# Patient Record
Sex: Female | Born: 2000 | Race: Black or African American | Hispanic: No | Marital: Single | Smoking: Never smoker
Health system: Southern US, Community
[De-identification: ages and names within clinical notes are randomized; demographics above are authoritative.]

## PROBLEM LIST (undated history)

## (undated) ENCOUNTER — Emergency Department (HOSPITAL_COMMUNITY): Disposition: A | Discharge status: Home / Self Care | Payer: Self-pay

## (undated) DIAGNOSIS — Z789 Other specified health status: Secondary | ICD-10-CM

## (undated) HISTORY — PX: MOUTH SURGERY: SHX715

## (undated) HISTORY — DX: Other specified health status: Z78.9

---
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
## 2019-01-10 ENCOUNTER — Encounter: Payer: Medicaid Other | Admitting: Advanced Practice Midwife

## 2019-01-12 ENCOUNTER — Encounter: Payer: Medicaid Other | Admitting: Obstetrics & Gynecology

## 2019-01-15 ENCOUNTER — Other Ambulatory Visit: Payer: Self-pay

## 2019-01-15 ENCOUNTER — Encounter (HOSPITAL_COMMUNITY): Payer: Self-pay | Admitting: Obstetrics and Gynecology

## 2019-01-15 ENCOUNTER — Inpatient Hospital Stay (HOSPITAL_COMMUNITY)
Admission: AD | Admit: 2019-01-15 | Discharge: 2019-01-15 | Disposition: A | Discharge status: Home / Self Care | Payer: Medicaid Other | Attending: Obstetrics and Gynecology | Admitting: Obstetrics and Gynecology

## 2019-01-15 ENCOUNTER — Inpatient Hospital Stay (HOSPITAL_COMMUNITY): Payer: Medicaid Other

## 2019-01-15 DIAGNOSIS — O26891 Other specified pregnancy related conditions, first trimester: Secondary | ICD-10-CM | POA: Diagnosis present

## 2019-01-15 DIAGNOSIS — O26899 Other specified pregnancy related conditions, unspecified trimester: Secondary | ICD-10-CM

## 2019-01-15 DIAGNOSIS — R109 Unspecified abdominal pain: Secondary | ICD-10-CM | POA: Insufficient documentation

## 2019-01-15 DIAGNOSIS — O3680X0 Pregnancy with inconclusive fetal viability, not applicable or unspecified: Secondary | ICD-10-CM

## 2019-01-15 DIAGNOSIS — Z8249 Family history of ischemic heart disease and other diseases of the circulatory system: Secondary | ICD-10-CM | POA: Insufficient documentation

## 2019-01-15 DIAGNOSIS — Z3A10 10 weeks gestation of pregnancy: Secondary | ICD-10-CM | POA: Insufficient documentation

## 2019-01-15 LAB — CBC
HCT: 35.6 % — ABNORMAL LOW (ref 36.0–46.0)
Hemoglobin: 12.2 g/dL (ref 12.0–15.0)
MCH: 31.9 pg (ref 26.0–34.0)
MCHC: 34.3 g/dL (ref 30.0–36.0)
MCV: 93 fL (ref 80.0–100.0)
Platelets: 213 10*3/uL (ref 150–400)
RBC: 3.83 MIL/uL — ABNORMAL LOW (ref 3.87–5.11)
RDW: 12.2 % (ref 11.5–15.5)
WBC: 13.2 10*3/uL — ABNORMAL HIGH (ref 4.0–10.5)
nRBC: 0 % (ref 0.0–0.2)

## 2019-01-15 LAB — URINALYSIS, ROUTINE W REFLEX MICROSCOPIC
Bilirubin Urine: NEGATIVE
Glucose, UA: NEGATIVE mg/dL
Ketones, ur: NEGATIVE mg/dL
Nitrite: NEGATIVE
Protein, ur: NEGATIVE mg/dL
Specific Gravity, Urine: 1.024 (ref 1.005–1.030)
WBC, UA: >50 WBC/hpf — ABNORMAL HIGH (ref 0–5)
pH: 5 (ref 5.0–8.0)

## 2019-01-15 LAB — WET PREP, GENITAL
Clue Cells Wet Prep HPF POC: NONE SEEN
Sperm: NONE SEEN
Trich, Wet Prep: NONE SEEN
Yeast Wet Prep HPF POC: NONE SEEN

## 2019-01-15 LAB — TYPE AND SCREEN
ABO/RH(D): A POS
Antibody Screen: NEGATIVE

## 2019-01-15 LAB — POCT PREGNANCY, URINE: Preg Test, Ur: POSITIVE — AB

## 2019-01-15 NOTE — Discharge Instructions (Signed)
Abdominal Pain During Pregnancy ° °Belly (abdominal) pain is common during pregnancy. There are many possible causes. Most of the time, it is not a serious problem. Other times, it can be a sign that something is wrong with the pregnancy. Always tell your doctor if you have belly pain. °Follow these instructions at home: °· Do not have sex or put anything in your vagina until your pain goes away completely. °· Get plenty of rest until your pain gets better. °· Drink enough fluid to keep your pee (urine) pale yellow. °· Take over-the-counter and prescription medicines only as told by your doctor. °· Keep all follow-up visits as told by your doctor. This is important. °Contact a doctor if: °· Your pain continues or gets worse after resting. °· You have lower belly pain that: °? Comes and goes at regular times. °? Spreads to your back. °? Feels like menstrual cramps. °· You have pain or burning when you pee (urinate). °Get help right away if: °· You have a fever or chills. °· You have vaginal bleeding. °· You are leaking fluid from your vagina. °· You are passing tissue from your vagina. °· You throw up (vomit) for more than 24 hours. °· You have watery poop (diarrhea) for more than 24 hours. °· Your baby is moving less than usual. °· You feel very weak or faint. °· You have shortness of breath. °· You have very bad pain in your upper belly. °Summary °· Belly (abdominal) pain is common during pregnancy. There are many possible causes. °· If you have belly pain during pregnancy, tell your doctor right away. °· Keep all follow-up visits as told by your doctor. This is important. °This information is not intended to replace advice given to you by your health care provider. Make sure you discuss any questions you have with your health care provider. °Document Released: 12/31/2008 Document Revised: 05/02/2018 Document Reviewed: 04/16/2016 °Elsevier Patient Education © 2020 [employer] ° °

## 2019-01-15 NOTE — MAU Provider Note (Signed)
History     [id number]  Arrival date and time: 01/15/19 0973    Chief Complaint  Patient presents with  . Abdominal Pain  . Nausea     HPI Heather Hoffman is a 18 y.o. at [redacted]w[redacted]d by LMP, who presents for abdominal pain and nausea.   Has been feeling nauseous and crampy for about a month Able to take PO, keep it down Cramping is down low in lower abdomen and pelvis No vaginal bleeding Patient's last menstrual period was 11/05/2018. Regular period prior to conception No prior abdominal surgery Has appt to establish OB care on 02/01/2018 No LOF Slightly thicker discharge since becoming pregnant, no odor but sometimes uncomfortable     OB History    Gravida  1   Para      Term      Preterm      AB      Living        SAB      TAB      Ectopic      Multiple      Live Births              History reviewed. No pertinent past medical history.  Past Surgical History:  Procedure Laterality Date  . MOUTH SURGERY      Family History  Problem Relation Age of Onset  . Hypertension Mother   . Hypertension Maternal Grandfather     Social History   Socioeconomic History  . Marital status: Single    Spouse name: Not on file  . Number of children: Not on file  . Years of education: Not on file  . Highest education level: Not on file  Occupational History  . Not on file  Tobacco Use  . Smoking status: Never Smoker  . Smokeless tobacco: Never Used  Substance and Sexual Activity  . Alcohol use: Yes    Comment: stopped after finding out she was pregnant  . Drug use: Yes    Types: Marijuana    Comment: stopped after finding out she was pregnant  . Sexual activity: Not on file  Other Topics Concern  . Not on file  Social History Narrative  . Not on file   Social Determinants of Health   Financial Resource Strain:   . Difficulty of Paying Living Expenses: Not on file  Food Insecurity:   . Worried About Charity fundraiser in the Last Year: Not on  file  . Ran Out of Food in the Last Year: Not on file  Transportation Needs:   . Lack of Transportation (Medical): Not on file  . Lack of Transportation (Non-Medical): Not on file  Physical Activity:   . Days of Exercise per Week: Not on file  . Minutes of Exercise per Session: Not on file  Stress:   . Feeling of Stress : Not on file  Social Connections:   . Frequency of Communication with Friends and Family: Not on file  . Frequency of Social Gatherings with Friends and Family: Not on file  . Attends Religious Services: Not on file  . Active Member of Clubs or Organizations: Not on file  . Attends Archivist Meetings: Not on file  . Marital Status: Not on file  Intimate Partner Violence:   . Fear of Current or Ex-Partner: Not on file  . Emotionally Abused: Not on file  . Physically Abused: Not on file  . Sexually Abused: Not on file  No Known Allergies  No current facility-administered medications on file prior to encounter.   Current Outpatient Medications on File Prior to Encounter  Medication Sig Dispense Refill  . Prenatal Vit-Fe Fumarate-FA (PRENATAL MULTIVITAMIN) TABS tablet Take 1 tablet by mouth daily at 12 noon.    Marland Kitchen ibuprofen (ADVIL,MOTRIN) 600 MG tablet Take 1 tablet (600 mg total) by mouth every 8 (eight) hours as needed for fever, headache or moderate pain. 30 tablet 0     ROS Complete ROS completed and otherwise negative except as noted in HPI  Physical Exam   BP 114/66 (BP Location: Right Arm)   Pulse 98   Temp 98.4 F (36.9 C) (Oral)   Resp 16   LMP 11/05/2018   SpO2 100%   Physical Exam  Vitals reviewed. Constitutional: She appears well-developed and well-nourished. No distress.  Eyes: No scleral icterus.  Respiratory: Effort normal. No respiratory distress.  GI: Soft. She exhibits no distension. There is no abdominal tenderness. There is no rebound and no guarding.  Genitourinary:    Genitourinary Comments: SVE: per Elvina Mattes as  patient declined female examiner, she reported no CMT, closed cervix, no adnexal masses   Musculoskeletal:        General: No edema.  Neurological: She is alert. Coordination normal.  Skin: Skin is warm and dry. She is not diaphoretic.  Psychiatric: She has a normal mood and affect.    Ultrasound IMPRESSION: Single live intrauterine pregnancy measuring 9 weeks 1 day  FHT Doppler 165 bpm  MAU Course  Procedures CBC T&S UA UCx Wet prep GC/CT  MDM Moderate  Assessment and Plan  #Pregnancy unknown location #Abdominal pain in pregnancy Patient presenting with abdominal pain and pregnancy of unknown location, workup unremarkable with IUP at [redacted]w[redacted]d by [place] (does not meet criteria to change dates), A+ blood type, negative wet prep, unremarkable UA. Reassured discomfort likely secondary to advancing gestational age, has plan to establish OB care already. Reviewed warning signs/return precautions, d/c to home in stable condition.   Venora Maples

## 2019-01-15 NOTE — MAU Note (Signed)
Pt here with complaints of lower abdominal cramping and nausea. Denies vaginal bleeding or discharge. States she found out she was pregnant about 1 month ago. Thinks she is about 10 weeks. LMP: 11/05/2018.

## 2019-01-16 LAB — ABO/RH: ABO/RH(D): A POS

## 2019-01-16 LAB — GC/CHLAMYDIA PROBE AMP (~~LOC~~) NOT AT ARMC
Chlamydia: POSITIVE — AB
Comment: NEGATIVE
Comment: NORMAL
Neisseria Gonorrhea: NEGATIVE

## 2019-01-17 ENCOUNTER — Telehealth: Payer: Self-pay | Admitting: Student

## 2019-01-17 ENCOUNTER — Other Ambulatory Visit: Payer: Self-pay | Admitting: Family Medicine

## 2019-01-17 DIAGNOSIS — A749 Chlamydial infection, unspecified: Secondary | ICD-10-CM

## 2019-01-17 MED ORDER — AZITHROMYCIN 500 MG PO TABS: 1000.0000 mg | ORAL_TABLET | Freq: Once | ORAL | 0 refills | Status: AC

## 2019-01-17 NOTE — Telephone Encounter (Addendum)
Heather Hoffman tested positive for  Chlamydia. Patient was called by RN and allergies and pharmacy confirmed. Rx sent to pharmacy of choice.   Jorje Guild, NP 01/17/2019 1:29 PM       ----- Message from Bjorn Loser, RN sent at 01/17/2019  1:22 PM EST ----- This patient tested positive for :  chlamydia  She has "NKDA", I have informed the patient of her results and confirmed her pharmacy is correct in her chart. Please send Rx.   Thank you,   Bjorn Loser, RN   Results faxed to [facility] Department.

## 2019-01-18 LAB — CULTURE, OB URINE: Culture: 80000 — AB

## 2019-01-19 ENCOUNTER — Encounter: Payer: Self-pay | Admitting: Student

## 2019-01-19 ENCOUNTER — Telehealth: Payer: Self-pay | Admitting: Student

## 2019-01-19 ENCOUNTER — Telehealth (HOSPITAL_COMMUNITY): Payer: Self-pay

## 2019-01-19 ENCOUNTER — Other Ambulatory Visit: Payer: Self-pay | Admitting: Student

## 2019-01-19 DIAGNOSIS — N3001 Acute cystitis with hematuria: Secondary | ICD-10-CM

## 2019-01-19 DIAGNOSIS — A749 Chlamydial infection, unspecified: Secondary | ICD-10-CM | POA: Insufficient documentation

## 2019-01-19 DIAGNOSIS — N390 Urinary tract infection, site not specified: Secondary | ICD-10-CM | POA: Insufficient documentation

## 2019-01-19 MED ORDER — AZITHROMYCIN 250 MG PO TABS: 1000.0000 mg | ORAL_TABLET | Freq: Once | ORAL | 0 refills | Status: AC

## 2019-01-19 MED ORDER — FOSFOMYCIN TROMETHAMINE 3 G PO PACK: 3.0000 g | PACK | Freq: Once | ORAL | 0 refills | Status: AC

## 2019-01-19 NOTE — Telephone Encounter (Signed)
Called patient and no answer x 2.  Called patient's mother, who said she will try to reach patient and have her call me.   Heather Hoffman

## 2019-01-20 ENCOUNTER — Other Ambulatory Visit: Payer: Self-pay | Admitting: Women's Health

## 2019-01-20 ENCOUNTER — Telehealth: Payer: Self-pay | Admitting: Women's Health

## 2019-01-20 NOTE — Telephone Encounter (Signed)
Attempted to contact patient. No answer on patient home/mobile phone number, not able to leave voicemail. Called mother's phone number, also listed in chart. No answer, no voicemail left as no greeting was present on voicemail box to confirm I was leaving message with appropriate person.  This is second attempt to contact patient in regards to positive urine culture.  Clarisa Fling, NP  8:27 AM 01/20/2019

## 2019-01-20 NOTE — Progress Notes (Signed)
Pt called back, informed about UTI, questions answered. Fosfomycin already sent to pharmacy by provider yesterday along with repeat azithromycin for chlamydia as patient reports vomiting first medication. Pt verbalizes understanding for treatment plan.  Clarisa Fling, NP  9:09 AM 01/20/2019

## 2019-01-21 ENCOUNTER — Telehealth: Payer: Self-pay | Admitting: Medical

## 2019-01-21 DIAGNOSIS — A749 Chlamydial infection, unspecified: Secondary | ICD-10-CM

## 2019-01-21 MED ORDER — AZITHROMYCIN 250 MG PO TABS: 1000.0000 mg | ORAL_TABLET | Freq: Once | ORAL | 0 refills | Status: AC

## 2019-01-21 NOTE — Telephone Encounter (Signed)
-----   Message from Bjorn Loser, RN sent at 01/19/2019 11:33 AM EST ----- This patient tested positive for:  chlamydia She "has NKDA", I have informed the patient of her results and confirmed her pharmacy is correct in her chart. Please send Rx.  Pt. Took the first Rx that was send to her and ended up vomiting 30 minutes later.  Could we reorder the medication for her again. Thank you,   Bjorn Loser, RN   Results faxed to [facility] Department.

## 2019-02-02 ENCOUNTER — Encounter: Payer: Self-pay | Admitting: Family Medicine

## 2019-02-02 ENCOUNTER — Other Ambulatory Visit (HOSPITAL_COMMUNITY)
Admission: RE | Admit: 2019-02-02 | Discharge: 2019-02-02 | Disposition: A | Discharge status: Home / Self Care | Payer: Medicaid Other | Source: Ambulatory Visit | Attending: Family Medicine | Admitting: Family Medicine

## 2019-02-02 ENCOUNTER — Ambulatory Visit (INDEPENDENT_AMBULATORY_CARE_PROVIDER_SITE_OTHER): Payer: Medicaid Other | Admitting: Family Medicine

## 2019-02-02 ENCOUNTER — Other Ambulatory Visit: Payer: Self-pay

## 2019-02-02 DIAGNOSIS — Z3401 Encounter for supervision of normal first pregnancy, first trimester: Secondary | ICD-10-CM

## 2019-02-02 DIAGNOSIS — Z3400 Encounter for supervision of normal first pregnancy, unspecified trimester: Secondary | ICD-10-CM | POA: Insufficient documentation

## 2019-02-02 DIAGNOSIS — Z3A12 12 weeks gestation of pregnancy: Secondary | ICD-10-CM | POA: Diagnosis not present

## 2019-02-02 MED ORDER — AMBULATORY NON FORMULARY MEDICATION: 1.0000 | 0 refills | Status: DC

## 2019-02-02 NOTE — Progress Notes (Signed)
  Subjective:  Heather Hoffman is a G1P0 [redacted]w[redacted]d by LMP c.w [place]. being seen today for her first obstetrical visit.  Her obstetrical history is significant for first pregnancy. Unintended pregnancy, but excited. FOB involved - patient's boyfriend.. Patient does intend to breast feed. Pregnancy history fully reviewed.  Patient reports no complaints.  BP 118/68   Pulse 91   Wt 118 lb (53.5 kg)   LMP 11/05/2018   BMI 19.05 kg/m   HISTORY: OB History  Gravida Para Term Preterm AB Living  1            SAB TAB Ectopic Multiple Live Births               # Outcome Date GA Lbr Len/2nd Weight Sex Delivery Anes PTL Lv  1 Current             No past medical history on file.  Past Surgical History:  Procedure Laterality Date  . MOUTH SURGERY      Family History  Problem Relation Age of Onset  . Hypertension Mother   . Hypertension Maternal Grandfather      Exam  BP 118/68   Pulse 91   Wt 118 lb (53.5 kg)   LMP 11/05/2018   BMI 19.05 kg/m   Chaperone present during exam  CONSTITUTIONAL: Well-developed, well-nourished female in no acute distress.  HENT:  Normocephalic, atraumatic, External right and left ear normal. Oropharynx is clear and moist EYES: Conjunctivae and EOM are normal. Pupils are equal, round, and reactive to light. No scleral icterus.  NECK: Normal range of motion, supple, no masses.  Normal thyroid.  CARDIOVASCULAR: Normal heart rate noted, regular rhythm RESPIRATORY: Clear to auscultation bilaterally. Effort and breath sounds normal, no problems with respiration noted. BREASTS: Symmetric in size. No masses, skin changes, nipple drainage, or lymphadenopathy. ABDOMEN: Soft, normal bowel sounds, no distention noted.  No tenderness, rebound or guarding.  PELVIC: Normal appearing external genitalia; normal appearing vaginal mucosa and cervix. No abnormal discharge noted. Normal uterine size, no other palpable masses, no uterine or adnexal tenderness. MUSCULOSKELETAL:  Normal range of motion. No tenderness.  No cyanosis, clubbing, or edema.  2+ distal pulses. SKIN: Skin is warm and dry. No rash noted. Not diaphoretic. No erythema. No pallor. NEUROLOGIC: Alert and oriented to person, place, and time. Normal reflexes, muscle tone coordination. No cranial nerve deficit noted. PSYCHIATRIC: Normal mood and affect. Normal behavior. Normal judgment and thought content.    Assessment:    Pregnancy: G1P0 Patient Active Problem List   Diagnosis Date Noted  . Supervision of normal first pregnancy, antepartum 02/02/2019  . Chlamydia 01/19/2019  . UTI (urinary tract infection) 01/19/2019      Plan:   1. Supervision of normal first pregnancy, antepartum FHT normal - SMN1 COPY NUMBER ANALYSIS (SMA Carrier Screen) - Cystic fibrosis gene test - Obstetric Panel, Including HIV - Hemoglobinopathy Evaluation - [place] MFM OB COMP + 14 WK; Future - Culture, OB Urine - CHL AMB BABYSCRIPTS SCHEDULE OPTIMIZATION - GC/Chlamydia probe amp ([facility])not at [facility] - Genetic Screening     Problem list reviewed and updated. 75% of 30 min visit spent on counseling and coordination of care.     Levie Heritage 02/02/2019

## 2019-02-04 LAB — URINE CULTURE, OB REFLEX

## 2019-02-04 LAB — CULTURE, OB URINE

## 2019-02-06 LAB — GC/CHLAMYDIA PROBE AMP (~~LOC~~) NOT AT ARMC
Chlamydia: POSITIVE — AB
Comment: NEGATIVE
Comment: NORMAL
Neisseria Gonorrhea: NEGATIVE

## 2019-02-07 ENCOUNTER — Encounter: Payer: Self-pay | Admitting: Family Medicine

## 2019-02-07 ENCOUNTER — Telehealth: Payer: Self-pay

## 2019-02-07 DIAGNOSIS — A749 Chlamydial infection, unspecified: Secondary | ICD-10-CM

## 2019-02-07 MED ORDER — AZITHROMYCIN 500 MG PO TABS: 1000.0000 mg | ORAL_TABLET | Freq: Every day | ORAL | 1 refills | Status: DC

## 2019-02-07 NOTE — Telephone Encounter (Signed)
Called pt regarding positive Chlamydia results. Pt advised to not have intercourse until ten days after she and partner are treated. Zithromax 1000 mg with 1 refill for pt's partner was sent to the pharmacy. STD form was faxed to [facility]. Understanding was voiced. Rukia Mcgillivray l Lashunda Greis, CMA

## 2019-02-09 ENCOUNTER — Telehealth: Payer: Self-pay

## 2019-02-09 NOTE — Telephone Encounter (Signed)
Patient called back and made aware of low risk Panorama results. Patient does not want to know gender - will pick up lett with her results for gender. Armandina Stammer RN

## 2019-02-09 NOTE — Telephone Encounter (Signed)
Called patient to give Panorama results. Phone rang with no answering machine available. Armandina Stammer RN

## 2019-02-10 ENCOUNTER — Other Ambulatory Visit: Payer: Self-pay | Admitting: Family Medicine

## 2019-02-10 DIAGNOSIS — A749 Chlamydial infection, unspecified: Secondary | ICD-10-CM

## 2019-02-14 LAB — OBSTETRIC PANEL, INCLUDING HIV
Antibody Screen: NEGATIVE
Basophils Absolute: 0 10*3/uL (ref 0.0–0.2)
Basos: 0 %
EOS (ABSOLUTE): 0 10*3/uL (ref 0.0–0.4)
Eos: 0 %
HIV Screen 4th Generation wRfx: NONREACTIVE
Hematocrit: 37.5 % (ref 34.0–46.6)
Hemoglobin: 12.4 g/dL (ref 11.1–15.9)
Hepatitis B Surface Ag: NEGATIVE
Immature Grans (Abs): 0 10*3/uL (ref 0.0–0.1)
Immature Granulocytes: 0 %
Lymphocytes Absolute: 2.5 10*3/uL (ref 0.7–3.1)
Lymphs: 20 %
MCH: 31.3 pg (ref 26.6–33.0)
MCHC: 33.1 g/dL (ref 31.5–35.7)
MCV: 95 fL (ref 79–97)
Monocytes Absolute: 0.6 10*3/uL (ref 0.1–0.9)
Monocytes: 5 %
Neutrophils Absolute: 8.9 10*3/uL — ABNORMAL HIGH (ref 1.4–7.0)
Neutrophils: 75 %
Platelets: 242 10*3/uL (ref 150–450)
RBC: 3.96 x10E6/uL (ref 3.77–5.28)
RDW: 12.8 % (ref 11.7–15.4)
RPR Ser Ql: REACTIVE — AB
Rh Factor: POSITIVE
Rubella Antibodies, IGG: 3.09 index (ref >0.99)
WBC: 12 10*3/uL — ABNORMAL HIGH (ref 3.4–10.8)

## 2019-02-14 LAB — HEMOGLOBINOPATHY EVALUATION
Ferritin: 48 ng/mL (ref 15–77)
Hgb A2 Quant: 2.3 % (ref 1.8–3.2)
Hgb A: 97.7 % (ref 96.4–98.8)
Hgb C: 0 %
Hgb F Quant: 0 % (ref 0.0–2.0)
Hgb S: 0 %
Hgb Solubility: NEGATIVE
Hgb Variant: 0 %

## 2019-02-14 LAB — SMN1 COPY NUMBER ANALYSIS (SMA CARRIER SCREENING)

## 2019-02-14 LAB — RPR, QUANT+TP ABS (REFLEX)
Rapid Plasma Reagin, Quant: 1:1 {titer} — ABNORMAL HIGH
T Pallidum Abs: NONREACTIVE

## 2019-02-14 LAB — CYSTIC FIBROSIS GENE TEST

## 2019-03-07 ENCOUNTER — Encounter: Payer: Medicaid Other | Admitting: Advanced Practice Midwife

## 2019-03-07 ENCOUNTER — Telehealth: Payer: Self-pay

## 2019-03-07 NOTE — Telephone Encounter (Signed)
Called patient to make her aware of missed appointment and to reschedule. Patient phone rings and then stopped ringing- unable to leave message.Armandina Stammer RN

## 2019-03-14 ENCOUNTER — Other Ambulatory Visit (HOSPITAL_COMMUNITY)
Admission: RE | Admit: 2019-03-14 | Discharge: 2019-03-14 | Disposition: A | Discharge status: Home / Self Care | Payer: Medicaid Other | Source: Ambulatory Visit | Attending: Advanced Practice Midwife | Admitting: Advanced Practice Midwife

## 2019-03-14 ENCOUNTER — Encounter: Payer: Self-pay | Admitting: Advanced Practice Midwife

## 2019-03-14 ENCOUNTER — Ambulatory Visit (INDEPENDENT_AMBULATORY_CARE_PROVIDER_SITE_OTHER): Payer: Medicaid Other | Admitting: Advanced Practice Midwife

## 2019-03-14 ENCOUNTER — Other Ambulatory Visit: Payer: Self-pay

## 2019-03-14 VITALS — BP 113/66 | HR 113 | Wt 125.0 lb

## 2019-03-14 DIAGNOSIS — Z3402 Encounter for supervision of normal first pregnancy, second trimester: Secondary | ICD-10-CM

## 2019-03-14 DIAGNOSIS — Z3A18 18 weeks gestation of pregnancy: Secondary | ICD-10-CM

## 2019-03-14 DIAGNOSIS — Z8619 Personal history of other infectious and parasitic diseases: Secondary | ICD-10-CM | POA: Diagnosis not present

## 2019-03-14 DIAGNOSIS — Z3400 Encounter for supervision of normal first pregnancy, unspecified trimester: Secondary | ICD-10-CM

## 2019-03-14 DIAGNOSIS — R898 Other abnormal findings in specimens from other organs, systems and tissues: Secondary | ICD-10-CM | POA: Insufficient documentation

## 2019-03-14 NOTE — Progress Notes (Signed)
   PRENATAL VISIT NOTE  Subjective:  Heather Hoffman is a 19 y.o. G1P0 at [redacted]w[redacted]d being seen today for ongoing prenatal care.  She is currently monitored for the following issues for this low-risk pregnancy and has Chlamydia; UTI (urinary tract infection); Supervision of normal first pregnancy, antepartum; Chlamydia infection during pregnancy, antepartum; Adjustment insomnia; Intractable migraine with aura without status migrainosus; Severe episode of recurrent major depressive disorder, without psychotic features (HCC); and Underweight on their problem list.  Patient reports no complaints.  Contractions: Not present. Vag. Bleeding: None.  Movement: Absent. Denies leaking of fluid.   The following portions of the patient's history were reviewed and updated as appropriate: allergies, current medications, past family history, past medical history, past social history, past surgical history and problem list.   Objective:   Vitals:   03/14/19 0957  BP: 113/66  Pulse: (!) 113  Weight: 125 lb (56.7 kg)    Fetal Status: Fetal Heart Rate (bpm): 144   Movement: Absent     General:  Alert, oriented and cooperative. Patient is in no acute distress.  Skin: Skin is warm and dry. No rash noted.   Cardiovascular: Normal heart rate noted  Respiratory: Normal respiratory effort, no problems with respiration noted  Abdomen: Soft, gravid, appropriate for gestational age.  Pain/Pressure: Present     Pelvic: Cervical exam deferred        Extremities: Normal range of motion.  Edema: None  Mental Status: Normal mood and affect. Normal behavior. Normal judgment and thought content.   Assessment and Plan:  Pregnancy: G1P0 at [redacted]w[redacted]d  .Supervision of pregnancy       [place] this week Chlamydia       TOC today  Preterm labor symptoms and general obstetric precautions including but not limited to vaginal bleeding, contractions, leaking of fluid and fetal movement were reviewed in detail with the patient. Please refer  to After Visit Summary for other counseling recommendations.   Return in about 4 weeks (around 04/11/2019) for [facility].  Future Appointments  Date Time Provider Department Center  03/21/2019  8:30 AM WH-MFC [place] 1 WH-MFCUS MFC-US   Didn't see note Re: SMA 2 copies until after pt left office Will schedule MFM consult to discuss options  Wynelle Bourgeois, CNM

## 2019-03-14 NOTE — Patient Instructions (Signed)

## 2019-03-15 LAB — GC/CHLAMYDIA PROBE AMP (~~LOC~~) NOT AT ARMC
Chlamydia: NEGATIVE
Comment: NEGATIVE
Comment: NORMAL
Neisseria Gonorrhea: NEGATIVE

## 2019-03-16 LAB — AFP, SERUM, OPEN SPINA BIFIDA
AFP MoM: 1.25
AFP Value: 63.7 ng/mL
Gest. Age on Collection Date: 18.3 weeks
Maternal Age At EDD: 20.1 yr
OSBR Risk 1 IN: 5576
Test Results:: NEGATIVE
Weight: 125 [lb_av]

## 2019-03-16 LAB — RPR: RPR Ser Ql: NONREACTIVE

## 2019-03-20 ENCOUNTER — Other Ambulatory Visit: Payer: Self-pay

## 2019-03-20 NOTE — Progress Notes (Signed)
error 

## 2019-03-21 ENCOUNTER — Ambulatory Visit (HOSPITAL_BASED_OUTPATIENT_CLINIC_OR_DEPARTMENT_OTHER): Payer: Medicaid Other | Admitting: Genetic Counselor

## 2019-03-21 ENCOUNTER — Ambulatory Visit (HOSPITAL_COMMUNITY)
Admission: RE | Admit: 2019-03-21 | Discharge: 2019-03-21 | Disposition: A | Discharge status: Home / Self Care | Payer: Medicaid Other | Source: Ambulatory Visit | Attending: Obstetrics and Gynecology | Admitting: Obstetrics and Gynecology

## 2019-03-21 ENCOUNTER — Other Ambulatory Visit: Payer: Self-pay

## 2019-03-21 ENCOUNTER — Other Ambulatory Visit (HOSPITAL_COMMUNITY): Payer: Self-pay | Admitting: *Deleted

## 2019-03-21 ENCOUNTER — Encounter (HOSPITAL_COMMUNITY): Payer: Self-pay

## 2019-03-21 ENCOUNTER — Other Ambulatory Visit: Payer: Self-pay | Admitting: Family Medicine

## 2019-03-21 ENCOUNTER — Ambulatory Visit (HOSPITAL_COMMUNITY): Payer: Self-pay | Admitting: Genetic Counselor

## 2019-03-21 ENCOUNTER — Ambulatory Visit (HOSPITAL_COMMUNITY): Payer: Medicaid Other | Admitting: *Deleted

## 2019-03-21 DIAGNOSIS — Z3400 Encounter for supervision of normal first pregnancy, unspecified trimester: Secondary | ICD-10-CM | POA: Insufficient documentation

## 2019-03-21 DIAGNOSIS — R898 Other abnormal findings in specimens from other organs, systems and tissues: Secondary | ICD-10-CM | POA: Diagnosis present

## 2019-03-21 DIAGNOSIS — O358XX0 Maternal care for other (suspected) fetal abnormality and damage, not applicable or unspecified: Secondary | ICD-10-CM

## 2019-03-21 DIAGNOSIS — Z3A19 19 weeks gestation of pregnancy: Secondary | ICD-10-CM | POA: Diagnosis not present

## 2019-03-21 DIAGNOSIS — Z315 Encounter for genetic counseling: Secondary | ICD-10-CM | POA: Diagnosis not present

## 2019-03-21 DIAGNOSIS — Z3689 Encounter for other specified antenatal screening: Secondary | ICD-10-CM

## 2019-03-21 DIAGNOSIS — Z31430 Encounter of female for testing for genetic disease carrier status for procreative management: Secondary | ICD-10-CM

## 2019-03-21 NOTE — Progress Notes (Signed)
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 Heather Hoffman came to her appointment alone due to COVID-19 visitor restrictions.   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 female. Her current pregnancy has completed [redacted]w[redacted]d(Estimated Date of Delivery: 08/12/19).  [name] exposure to environmental toxins or chemical agents. She reported smoking cigarettes, consuming alcohol, and using marijuana prior to learning of the pregnancy at ~6 weeks' gestation. She denied significant viral illnesses, fevers, and bleeding during the course of her pregnancy. Her medical and surgical histories were noncontributory.  Family History  A three generation pedigree was drafted and reviewed. The family history is remarkable for the following:  - Heather Hoffman a paternal uncle who has a son who was born without a leg. We discussed that limb reduction defects are a form of congenital birth defects. Approximately 3-5% of pregnancies are affected by a congenital birth defect of some kind. Given that the relative with a limb reduction defect is a fourth degree relative to Heather Hoffman's fetus, recurrence risk is likely not greatly elevated over general population risk.  The remaining family histories were reviewed and found to be noncontributory for birth defects, intellectual disability, recurrent pregnancy loss, and known genetic conditions. Ms. BSelphhad limited information about her partner's family history; thus, risk assessment was limited.  The patient's ethnicity is African  American. The father of the pregnancy's ethnicity is [place] Ashkenazi Jewish ancestry and consanguinity were denied. Pedigree will  be scanned under Media.  Discussion  Heather Hoffman had carrier screening for spinal muscular atrophy (SMA) performed through [facility]. 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 This puts her at increased risk (1 in 34, or ~3%) to be a silent 2+0 carrier for SMA. SMA is a condition caused by mutations in the SMN1 gene. Typically, individuals have two copies of the SMN1 gene, with one copy present on each chromosome. In SMA silent carriers, both copies of the SMN1 gene are present on one chromosome, with no copies of SMN1 present on the other chromosome.   SMA is characterized by progressive muscle weakness and atrophy due to degeneration and loss of anterior horn cells (lower motor neurons) in the spinal cord and brain stem. We discussed the different types of SMA (0, I, II, and III), including differences in severity and age of onset. We also reviewed the autosomal recessive inheritance pattern of SMA. We discussed that the couple only has a chance of having a child with SMA if both parents are identified as carriers for the condition. Based on the carrier frequency for SMA in the African American population, Heather Hoffman's partner currently has a 1 in 61chance of being a carrier of SMA. Without partner screening to refine risk and based on her partner's ethnicity, the couple currently has a 1 in 8976 (0.01%) risk of having a child with SMA. If Heather Hoffman's partner were found to have 2 copies of SMN1, his risk of being a carrier is reduced but not eliminated. However, if both parents are carriers of SMA, they would  have a 1 in 4 (25%) chance of having an affected fetus. We discussed that carrier screening for SMA is recommended for Heather Hoffman's partner to refine their risk of having an affected child. Heather Hoffman indicated that she is interested in pursuing partner  carrier screening.  Heather Hoffman also had negative carrier screening for cystic fibrosis (CF) and hemoglobinopathies. Heather Hoffman was negative for the 32 mutations analyzed in the CFTR gene associated with CF. Based on this negative result and her ethnic background, she has a 1 in 240 (0.4%) residual risk of being a carrier for CF. Heather Hoffman also had a normal hemoglobin electrophoresis, significantly reducing her risk of her being a carrier for hemoglobinopathies such as sickle cell disease. These negative carrier screening results significantly decrease the chance of her pregnancies being affected by CF or a hemoglobinopathy.   We also reviewed that Heather Hoffman had Panorama NIPS through the laboratory [facility] that was low-risk for fetal aneuploidies. We reviewed that these results showed a less than 1 in 10,000 risk for trisomies 21, 18 and 13, and monosomy X (Turner syndrome).  In addition, the risk for triploidy and sex chromosome trisomies (47,XXX and 47,XXY) was also low. Heather Hoffman elected to have cfDNA analysis for 22q11.2 deletion syndrome, which was also low risk (1 in 9000). We reviewed that while this testing identifies 94-99% of pregnancies with trisomy 24, trisomy 59, trisomy 73, sex chromosome aneuploidies, and triploidy, it is NOT diagnostic. A positive test result requires confirmation by CV[facility] or amniocentesis, and a negative test result does not rule out a fetal chromosome abnormality. She also understands that this testing does not identify all genetic conditions.  A complete ultrasound was performed today prior to our visit. The ultrasound report will be sent under separate cover. An echogenic intracardiac focus (EIF) was identified on ultrasound. This finding is considered to be a normal variant in the context of low risk NIPS results. There were no other visualized fetal anomalies or markers suggestive of aneuploidy. Heather Hoffman understands that screening tests, including ultrasound, cannot rule  out all birth defects or genetic syndromes.  Heather Hoffman was also counseled regarding diagnostic testing via amniocentesis. We discussed the technical aspects of the procedure and quoted up to a 1 in 500 (0.2%) risk for spontaneous pregnancy loss or other adverse pregnancy outcomes as a result of amniocentesis. Cultured cells from an amniocentesis sample allow for the visualization of a fetal karyotype, which can detect >99% of chromosomal aberrations. Chromosomal microarray can also be performed to identify smaller deletions or duplications of fetal chromosomal material. Amniocentesis could also be performed to assess whether the baby is affected by SMA. After careful consideration, Ms. Mccadden declined amniocentesis at this time. She understands that amniocentesis is available at any point after 16 weeks of pregnancy and that she may opt to undergo the procedure at a later date should she change her mind.  Ms. Rensch was interested in pursuing SMA carrier screening for her partner, Debarah Crape. However, Ms. Grey does not think that he has health insurance.We discussed that if Mr. Tamala Julian does not have insurance, I could potentially facilitate free testing through the laboratory [facility]'s Patient Assistance Program. Ms. Lares and I made a plan for herto contact me once shehas had an opportunity to discuss carrier screening withher partner.Icansend him asaliva kit andplace the orderonce I hear from her. Ms. Botz was agreeable to this plan.  I counseled Ms. Mokuleia regarding the above risks and available options. The approximate face-to-face  time with the genetic counselor was 30 minutes.  In summary:  Discussed carrier screening results and options for follow-up testing  Increased risk to be silent carrier for spinal muscular atrophy  Desires partner carrier screening. She will contact me after discussing testing with her partner. I will facilitate testing from there  Reviewed low-risk  NIPS results  Reduction in risk for Down syndrome,trisomy 18,trisomy 54, sex chromosome aneuploidies, and 22q11.2deletion syndrome  Reviewed results of ultrasound  EIF identified in context of low-risk NIPS  No other fetal anomalies or markers seen  Offered additional testing and screening  Declined amniocentesis  Reviewed family history concerns   Buelah Manis, MS Genetic Counselor

## 2019-04-11 ENCOUNTER — Ambulatory Visit (INDEPENDENT_AMBULATORY_CARE_PROVIDER_SITE_OTHER): Payer: Medicaid Other | Admitting: Advanced Practice Midwife

## 2019-04-11 ENCOUNTER — Other Ambulatory Visit: Payer: Self-pay

## 2019-04-11 ENCOUNTER — Encounter: Payer: Self-pay | Admitting: Advanced Practice Midwife

## 2019-04-11 VITALS — BP 114/56 | HR 90 | Wt 128.0 lb

## 2019-04-11 DIAGNOSIS — O283 Abnormal ultrasonic finding on antenatal screening of mother: Secondary | ICD-10-CM

## 2019-04-11 DIAGNOSIS — Z3A22 22 weeks gestation of pregnancy: Secondary | ICD-10-CM

## 2019-04-11 DIAGNOSIS — Z3400 Encounter for supervision of normal first pregnancy, unspecified trimester: Secondary | ICD-10-CM

## 2019-04-11 NOTE — Progress Notes (Signed)
   PRENATAL VISIT NOTE  Subjective:  Heather Hoffman is a 19 y.o. G1P0 at [redacted]w[redacted]d being seen today for ongoing prenatal care.  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  Patient reports no complaints.  Contractions: Not present. Vag. Bleeding: None.  Movement: Present. Denies leaking of fluid.   The following portions of the patient's history were reviewed and updated as appropriate: allergies, current medications, past family history, past medical history, past social history, past surgical history and problem list.   Objective:   Vitals:   04/11/19 1040  BP: (!) 114/56  Pulse: 90  Weight: 128 lb (58.1 kg)    Fetal Status: Fetal Heart Rate (bpm): 150   Movement: Present     General:  Alert, oriented and cooperative. Patient is in no acute distress.  Skin: Skin is warm and dry. No rash noted.   Cardiovascular: Normal heart rate noted  Respiratory: Normal respiratory effort, no problems with respiration noted  Abdomen: Soft, gravid, appropriate for gestational age.  Pain/Pressure: Present     Pelvic: Cervical exam deferred        Extremities: Normal range of motion.  Edema: None  Mental Status: Normal mood and affect. Normal behavior. Normal judgment and thought content.   Assessment and Plan:  Pregnancy: G1P0 at [redacted]w[redacted]d  [place] on 03/21/19 showed normal pregnancy with LV EIF.  Cell free dna test was normal   They scheduled a followup US in 4 weeks  Preterm labor symptoms and general obstetric precautions including but not limited to vaginal bleeding, contractions, leaking of fluid  and fetal movement were reviewed in detail with the patient. Please refer to After Visit Summary for other counseling recommendations.   Return in about 4 weeks (around 05/09/2019) for [facility].  Future Appointments  Date Time Provider Department Center  04/18/2019  8:30 AM WH-MFC NURSE WH-MFC MFC-US  04/18/2019  8:30 AM WH-MFC [place] 1 WH-MFCUS MFC-US  05/09/2019 10:30 AM Aviva Signs, CNM [facility] None    Wynelle Bourgeois, CNM

## 2019-04-11 NOTE — Patient Instructions (Signed)

## 2019-04-18 ENCOUNTER — Ambulatory Visit (HOSPITAL_COMMUNITY): Admission: RE | Admit: 2019-04-18 | Payer: Medicaid Other | Source: Ambulatory Visit

## 2019-04-18 ENCOUNTER — Ambulatory Visit (HOSPITAL_COMMUNITY): Payer: Medicaid Other

## 2019-04-21 ENCOUNTER — Ambulatory Visit (HOSPITAL_COMMUNITY)
Admission: RE | Admit: 2019-04-21 | Discharge: 2019-04-21 | Disposition: A | Discharge status: Home / Self Care | Payer: Medicaid Other | Source: Ambulatory Visit | Attending: Obstetrics | Admitting: Obstetrics

## 2019-04-21 ENCOUNTER — Other Ambulatory Visit: Payer: Self-pay

## 2019-04-21 ENCOUNTER — Ambulatory Visit (HOSPITAL_COMMUNITY): Payer: Medicaid Other | Admitting: *Deleted

## 2019-04-21 ENCOUNTER — Encounter (HOSPITAL_COMMUNITY): Payer: Self-pay

## 2019-04-21 DIAGNOSIS — R898 Other abnormal findings in specimens from other organs, systems and tissues: Secondary | ICD-10-CM

## 2019-04-21 DIAGNOSIS — Z3689 Encounter for other specified antenatal screening: Secondary | ICD-10-CM | POA: Diagnosis present

## 2019-04-21 DIAGNOSIS — O358XX0 Maternal care for other (suspected) fetal abnormality and damage, not applicable or unspecified: Secondary | ICD-10-CM | POA: Diagnosis not present

## 2019-04-21 DIAGNOSIS — Z3400 Encounter for supervision of normal first pregnancy, unspecified trimester: Secondary | ICD-10-CM | POA: Diagnosis present

## 2019-04-21 DIAGNOSIS — Z363 Encounter for antenatal screening for malformations: Secondary | ICD-10-CM | POA: Diagnosis not present

## 2019-04-21 DIAGNOSIS — Z148 Genetic carrier of other disease: Secondary | ICD-10-CM

## 2019-04-21 DIAGNOSIS — Z3A22 22 weeks gestation of pregnancy: Secondary | ICD-10-CM

## 2019-04-24 ENCOUNTER — Other Ambulatory Visit (HOSPITAL_COMMUNITY): Payer: Self-pay | Admitting: *Deleted

## 2019-04-24 DIAGNOSIS — Z362 Encounter for other antenatal screening follow-up: Secondary | ICD-10-CM

## 2019-05-09 ENCOUNTER — Other Ambulatory Visit: Payer: Self-pay

## 2019-05-09 ENCOUNTER — Encounter: Payer: Self-pay | Admitting: Advanced Practice Midwife

## 2019-05-09 ENCOUNTER — Ambulatory Visit (INDEPENDENT_AMBULATORY_CARE_PROVIDER_SITE_OTHER): Payer: Medicaid Other | Admitting: Advanced Practice Midwife

## 2019-05-09 VITALS — BP 100/56 | HR 86 | Wt 136.0 lb

## 2019-05-09 DIAGNOSIS — O283 Abnormal ultrasonic finding on antenatal screening of mother: Secondary | ICD-10-CM

## 2019-05-09 DIAGNOSIS — Z3400 Encounter for supervision of normal first pregnancy, unspecified trimester: Secondary | ICD-10-CM

## 2019-05-09 DIAGNOSIS — R898 Other abnormal findings in specimens from other organs, systems and tissues: Secondary | ICD-10-CM

## 2019-05-09 DIAGNOSIS — Z3402 Encounter for supervision of normal first pregnancy, second trimester: Secondary | ICD-10-CM

## 2019-05-09 DIAGNOSIS — Z3A25 25 weeks gestation of pregnancy: Secondary | ICD-10-CM

## 2019-05-09 NOTE — Patient Instructions (Signed)
Glucose Tolerance Test During Pregnancy Why am I having this test? The glucose tolerance test (GTT) is done to check how your body processes sugar (glucose). This is one of several tests used to diagnose diabetes that develops during pregnancy (gestational diabetes mellitus). Gestational diabetes is a temporary form of diabetes that some women develop during pregnancy. It usually occurs during the second trimester of pregnancy and goes away after delivery. Testing (screening) for gestational diabetes usually occurs between 24 and 28 weeks of pregnancy. You may have the GTT test after having a 1-hour glucose screening test if the results from that test indicate that you may have gestational diabetes. You may also have this test if:  You have a history of gestational diabetes.  You have a history of giving birth to very large babies or have experienced repeated fetal loss (stillbirth).  You have signs and symptoms of diabetes, such as: ? Changes in your vision. ? Tingling or numbness in your hands or feet. ? Changes in hunger, thirst, and urination that are not otherwise explained by your pregnancy. What is being tested? This test measures the amount of glucose in your blood at different times during a period of 3 hours. This indicates how well your body is able to process glucose. What kind of sample is taken?  Blood samples are required for this test. They are usually collected by inserting a needle into a blood vessel. How do I prepare for this test?  For 3 days before your test, eat normally. Have plenty of carbohydrate-rich foods.  Follow instructions from your health care provider about: ? Eating or drinking restrictions on the day of the test. You may be asked to not eat or drink anything other than water (fast) starting 8-10 hours before the test. ? Changing or stopping your regular medicines. Some medicines may interfere with this test. Tell a health care provider about:  All  medicines you are taking, including vitamins, herbs, eye drops, creams, and over-the-counter medicines.  Any blood disorders you have.  Any surgeries you have had.  Any medical conditions you have. What happens during the test? First, your blood glucose will be measured. This is referred to as your fasting blood glucose, since you fasted before the test. Then, you will drink a glucose solution that contains a certain amount of glucose. Your blood glucose will be measured again 1, 2, and 3 hours after drinking the solution. This test takes about 3 hours to complete. You will need to stay at the testing location during this time. During the testing period:  Do not eat or drink anything other than the glucose solution.  Do not exercise.  Do not use any products that contain nicotine or tobacco, such as cigarettes and e-cigarettes. If you need help stopping, ask your health care provider. The testing procedure may vary among health care providers and hospitals. How are the results reported? Your results will be reported as milligrams of glucose per deciliter of blood (mg/dL) or millimoles per liter (mmol/L). Your health care provider will compare your results to normal ranges that were established after testing a large group of people (reference ranges). Reference ranges may vary among labs and hospitals. For this test, common reference ranges are:  Fasting: less than 95-105 mg/dL (5.3-5.8 mmol/L).  1 hour after drinking glucose: less than 180-190 mg/dL (10.0-10.5 mmol/L).  2 hours after drinking glucose: less than 155-165 mg/dL (8.6-9.2 mmol/L).  3 hours after drinking glucose: 140-145 mg/dL (7.8-8.1 mmol/L). What do the   results mean? Results within reference ranges are considered normal, meaning that your glucose levels are well-controlled. If two or more of your blood glucose levels are high, you may be diagnosed with gestational diabetes. If only one level is high, your health care  provider may suggest repeat testing or other tests to confirm a diagnosis. Talk with your health care provider about what your results mean. Questions to ask your health care provider Ask your health care provider, or the department that is doing the test:  When will my results be ready?  How will I get my results?  What are my treatment options?  What other tests do I need?  What are my next steps? Summary  The glucose tolerance test (GTT) is one of several tests used to diagnose diabetes that develops during pregnancy (gestational diabetes mellitus). Gestational diabetes is a temporary form of diabetes that some women develop during pregnancy.  You may have the GTT test after having a 1-hour glucose screening test if the results from that test indicate that you may have gestational diabetes. You may also have this test if you have any symptoms or risk factors for gestational diabetes.  Talk with your health care provider about what your results mean. This information is not intended to replace advice given to you by your health care provider. Make sure you discuss any questions you have with your health care provider. Document Revised: 05/05/2018 Document Reviewed: 08/24/2016 Elsevier Patient Education  2020 [employer] Second Trimester of Pregnancy  The second trimester is from week 14 through week 27 (month 4 through 6). This is often the time in pregnancy that you feel your best. Often times, morning sickness has lessened or quit. You may have more energy, and you may get hungry more often. Your unborn baby is growing rapidly. At the end of the sixth month, he or she is about 9 inches long and weighs about 1 pounds. You will likely feel the baby move between 18 and 20 weeks of pregnancy. Follow these instructions at home: Medicines  Take over-the-counter and prescription medicines only as told by your doctor. Some medicines are safe and some medicines are not safe during  pregnancy.  Take a prenatal vitamin that contains at least 600 micrograms (mcg) of folic acid.  If you have trouble pooping (constipation), take medicine that will make your stool soft (stool softener) if your doctor approves. Eating and drinking   Eat regular, healthy meals.  Avoid raw meat and uncooked cheese.  If you get low calcium from the food you eat, talk to your doctor about taking a daily calcium supplement.  Avoid foods that are high in fat and sugars, such as fried and sweet foods.  If you feel sick to your stomach (nauseous) or throw up (vomit): ? Eat 4 or 5 small meals a day instead of 3 large meals. ? Try eating a few soda crackers. ? Drink liquids between meals instead of during meals.  To prevent constipation: ? Eat foods that are high in fiber, like fresh fruits and vegetables, whole grains, and beans. ? Drink enough fluids to keep your pee (urine) clear or pale yellow. Activity  Exercise only as told by your doctor. Stop exercising if you start to have cramps.  Do not exercise if it is too hot, too humid, or if you are in a place of great height (high altitude).  Avoid heavy lifting.  Wear low-heeled shoes. Sit and stand up straight.  You can continue   to have sex unless your doctor tells you not to. Relieving pain and discomfort  Wear a good support bra if your breasts are tender.  Take warm water baths (sitz baths) to soothe pain or discomfort caused by hemorrhoids. Use hemorrhoid cream if your doctor approves.  Rest with your legs raised if you have leg cramps or low back pain.  If you develop puffy, bulging veins (varicose veins) in your legs: ? Wear support hose or compression stockings as told by your doctor. ? Raise (elevate) your feet for 15 minutes, 3-4 times a day. ? Limit salt in your food. Prenatal care  Write down your questions. Take them to your prenatal visits.  Keep all your prenatal visits as told by your doctor. This is  important. Safety  Wear your seat belt when driving.  Make a list of emergency phone numbers, including numbers for family, friends, the hospital, and police and fire departments. General instructions  Ask your doctor about the right foods to eat or for help finding a counselor, if you need these services.  Ask your doctor about local prenatal classes. Begin classes before month 6 of your pregnancy.  Do not use hot tubs, steam rooms, or saunas.  Do not douche or use tampons or scented sanitary pads.  Do not cross your legs for long periods of time.  Visit your dentist if you have not done so. Use a soft toothbrush to brush your teeth. Floss gently.  Avoid all smoking, herbs, and alcohol. Avoid drugs that are not approved by your doctor.  Do not use any products that contain nicotine or tobacco, such as cigarettes and e-cigarettes. If you need help quitting, ask your doctor.  Avoid cat litter boxes and soil used by cats. These carry germs that can cause birth defects in the baby and can cause a loss of your baby (miscarriage) or stillbirth. Contact a doctor if:  You have mild cramps or pressure in your lower belly.  You have pain when you pee (urinate).  You have bad smelling fluid coming from your vagina.  You continue to feel sick to your stomach (nauseous), throw up (vomit), or have watery poop (diarrhea).  You have a nagging pain in your belly area.  You feel dizzy. Get help right away if:  You have a fever.  You are leaking fluid from your vagina.  You have spotting or bleeding from your vagina.  You have severe belly cramping or pain.  You lose or gain weight rapidly.  You have trouble catching your breath and have chest pain.  You notice sudden or extreme puffiness (swelling) of your face, hands, ankles, feet, or legs.  You have not felt the baby move in over an hour.  You have severe headaches that do not go away when you take medicine.  You have  trouble seeing. Summary  The second trimester is from week 14 through week 27 (months 4 through 6). This is often the time in pregnancy that you feel your best.  To take care of yourself and your unborn baby, you will need to eat healthy meals, take medicines only if your doctor tells you to do so, and do activities that are safe for you and your baby.  Call your doctor if you get sick or if you notice anything unusual about your pregnancy. Also, call your doctor if you need help with the right food to eat, or if you want to know what activities are safe for you. This   information is not intended to replace advice given to you by your health care provider. Make sure you discuss any questions you have with your health care provider. Document Revised: 05/06/2018 Document Reviewed: 02/18/2016 Elsevier Patient Education  2020 [employer]  

## 2019-05-09 NOTE — Progress Notes (Signed)
   PRENATAL VISIT NOTE  Subjective:  Heather Hoffman is a 19 y.o. G1P0 at [redacted]w[redacted]d being seen today for ongoing prenatal care.  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  Patient reports no complaints.  Contractions: Not present. Vag. Bleeding: None.  Movement: Present. Denies leaking of fluid.   The following portions of the patient's history were reviewed and updated as appropriate: allergies, current medications, past family history, past medical history, past social history, past surgical history and problem list.   Objective:   Vitals:   05/09/19 1045  BP: (!) 100/56  Pulse: 86  Weight: 136 lb (61.7 kg)    Fetal Status: Fetal Heart Rate (bpm): 145   Movement: Present     General:  Alert, oriented and cooperative. Patient is in no acute distress.  Skin: Skin is warm and dry. No rash noted.   Cardiovascular: Normal heart rate noted  Respiratory: Normal respiratory effort, no problems with respiration noted  Abdomen: Soft, gravid, appropriate for gestational age.  Pain/Pressure: Present     Pelvic: Cervical exam deferred        Extremities: Normal range of motion.  Edema: None  Mental Status: Normal mood and affect. Normal behavior. Normal judgment and thought content.   Assessment and Plan:  Pregnancy: G1P0 at [redacted]w[redacted]d Patient Active Problem List   Diagnosis Date Noted  . Abnormal genetic test 03/14/2019  . Chlamydia infection during pregnancy, antepartum 02/07/2019  . Supervision of normal first pregnancy, antepartum 02/02/2019  . Chlamydia 01/19/2019  . UTI (urinary  tract infection) 01/19/2019  . Underweight 11/29/2017  . Adjustment insomnia 04/09/2017  . Severe episode of recurrent major depressive disorder, without psychotic features (HCC) 03/18/2017  . Intractable migraine with aura without status migrainosus 03/31/2016   Had genetic counseling in March They plan on having FOB tested EIF seen but NIPS and [place] are otherwise low risk  Plans glucola next week  Preterm labor symptoms and general obstetric precautions including but not limited to vaginal bleeding, contractions, leaking of fluid and fetal movement were reviewed in detail with the patient. Please refer to After Visit Summary for other counseling recommendations.   Return in about 3 weeks (around 05/30/2019) for [facility].  Future Appointments  Date Time Provider Department Center  05/22/2019 12:40 PM [facility] NURSE [facility]  05/22/2019 12:45 PM [facility] [place] 2 [facility]  05/23/2019 10:10 AM Aviva Signs, CNM [facility] None  05/30/2019  9:25 AM Marny Lowenstein, PA-C [facility] None    Wynelle Bourgeois, CNM

## 2019-05-22 ENCOUNTER — Ambulatory Visit (HOSPITAL_COMMUNITY): Admission: RE | Admit: 2019-05-22 | Payer: Medicaid Other | Source: Ambulatory Visit

## 2019-05-22 ENCOUNTER — Ambulatory Visit (HOSPITAL_COMMUNITY): Payer: Medicaid Other

## 2019-05-22 ENCOUNTER — Encounter (HOSPITAL_COMMUNITY): Payer: Self-pay

## 2019-05-23 ENCOUNTER — Encounter: Payer: Medicaid Other | Admitting: Advanced Practice Midwife

## 2019-05-30 ENCOUNTER — Encounter: Payer: Self-pay | Admitting: Medical

## 2019-05-30 ENCOUNTER — Other Ambulatory Visit: Payer: Self-pay

## 2019-05-30 ENCOUNTER — Ambulatory Visit (INDEPENDENT_AMBULATORY_CARE_PROVIDER_SITE_OTHER): Payer: Medicaid Other | Admitting: Medical

## 2019-05-30 VITALS — BP 120/65 | HR 67 | Wt 138.0 lb

## 2019-05-30 DIAGNOSIS — A749 Chlamydial infection, unspecified: Secondary | ICD-10-CM

## 2019-05-30 DIAGNOSIS — O98819 Other maternal infectious and parasitic diseases complicating pregnancy, unspecified trimester: Secondary | ICD-10-CM

## 2019-05-30 DIAGNOSIS — F332 Major depressive disorder, recurrent severe without psychotic features: Secondary | ICD-10-CM

## 2019-05-30 DIAGNOSIS — Z3400 Encounter for supervision of normal first pregnancy, unspecified trimester: Secondary | ICD-10-CM

## 2019-05-30 DIAGNOSIS — R898 Other abnormal findings in specimens from other organs, systems and tissues: Secondary | ICD-10-CM

## 2019-05-30 NOTE — Progress Notes (Signed)
   PRENATAL VISIT NOTE  Subjective:  Heather Hoffman is a 19 y.o. G1P0 at [redacted]w[redacted]d being seen today for ongoing prenatal care.  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  Patient reports no complaints.  Contractions: Not present. Vag. Bleeding: None.  Movement: Present. Denies leaking of fluid.   The following portions of the patient's history were reviewed and updated as appropriate: allergies, current medications, past family history, past medical history, past social history, past surgical history and problem list.   Objective:   Vitals:   05/30/19 0923  BP: 120/65  Pulse: 67  Weight: 138 lb (62.6 kg)    Fetal Status: Fetal Heart Rate (bpm): 138 Fundal Height: 29 cm Movement: Present     General:  Alert, oriented and cooperative. Patient is in no acute distress.  Skin: Skin is warm and dry. No rash noted.   Cardiovascular: Normal heart rate noted  Respiratory: Normal respiratory effort, no problems with respiration noted  Abdomen: Soft, gravid, appropriate for gestational age.  Pain/Pressure: Absent     Pelvic: Cervical exam deferred        Extremities: Normal range of motion.  Edema: None  Mental Status: Normal mood and affect. Normal behavior. Normal judgment and thought content.   Assessment and Plan:  Pregnancy: G1P0 at [redacted]w[redacted]d 1. Supervision of normal first pregnancy, antepartum - Glucose Tolerance, 2 Hours w/1 Hour - HIV antibody (with reflex) - CBC - RPR - Declined TDap today  - Planning to breast feed - Peds list given, considering [facility] in [facility] -  Discussed MOC options, will consider IP LARC, info included on AVS - Follow-up dating [place] 5/18  2. Chlamydia infection during pregnancy, antepartum - TOC negative   3. Severe episode of recurrent major depressive disorder, without psychotic features (HCC)  4. Abnormal genetic test - SMA carrier, had genetic counseling appointment with MFM   Preterm labor symptoms and general obstetric precautions including but not limited to vaginal bleeding, contractions, leaking of fluid and fetal movement were reviewed in detail with the patient. Please refer to After Visit Summary for other counseling recommendations.   Return in about 4 weeks (around 06/27/2019) for LOB, Babyscipts.  Future Appointments  Date Time Provider Department Center  06/13/2019  9:30 AM [facility] NURSE [facility]  06/13/2019  9:30 AM [facility] US3 [facility]    Vonzella Nipple, PA-C

## 2019-05-30 NOTE — Patient Instructions (Addendum)
Fetal Movement Counts Patient Name: ________________________________________________ Patient Due Date: ____________________ What is a fetal movement count?  A fetal movement count is the number of times that you feel your baby move during a certain amount of time. This may also be called a fetal kick count. A fetal movement count is recommended for every pregnant woman. You may be asked to start counting fetal movements as early as week 28 of your pregnancy. Pay attention to when your baby is most active. You may notice your baby's sleep and wake cycles. You may also notice things that make your baby move more. You should do a fetal movement count:  When your baby is normally most active.  At the same time each day. A good time to count movements is while you are resting, after having something to eat and drink. How do I count fetal movements? 1. Find a quiet, comfortable area. Sit, or lie down on your side. 2. Write down the date, the start time and stop time, and the number of movements that you felt between those two times. Take this information with you to your health care visits. 3. Write down your start time when you feel the first movement. 4. Count kicks, flutters, swishes, rolls, and jabs. You should feel at least 10 movements. 5. You may stop counting after you have felt 10 movements, or if you have been counting for 2 hours. Write down the stop time. 6. If you do not feel 10 movements in 2 hours, contact your health care provider for further instructions. Your health care provider may want to do additional tests to assess your baby's well-being. Contact a health care provider if:  You feel fewer than 10 movements in 2 hours.  Your baby is not moving like he or she usually does. Date: ____________ Start time: ____________ Stop time: ____________ Movements: ____________ Date: ____________ Start time: ____________ Stop time: ____________ Movements: ____________ Date: ____________  Start time: ____________ Stop time: ____________ Movements: ____________ Date: ____________ Start time: ____________ Stop time: ____________ Movements: ____________ Date: ____________ Start time: ____________ Stop time: ____________ Movements: ____________ Date: ____________ Start time: ____________ Stop time: ____________ Movements: ____________ Date: ____________ Start time: ____________ Stop time: ____________ Movements: ____________ Date: ____________ Start time: ____________ Stop time: ____________ Movements: ____________ Date: ____________ Start time: ____________ Stop time: ____________ Movements: ____________ This information is not intended to replace advice given to you by your health care provider. Make sure you discuss any questions you have with your health care provider. Document Revised: 09/01/2018 Document Reviewed: 09/01/2018 Elsevier Patient Education  2020 Elsevier Inc. Braxton Hicks Contractions Contractions of the uterus can occur throughout pregnancy, but they are not always a sign that you are in labor. You may have practice contractions called Braxton Hicks contractions. These false labor contractions are sometimes confused with true labor. What are Braxton Hicks contractions? Braxton Hicks contractions are tightening movements that occur in the muscles of the uterus before labor. Unlike true labor contractions, these contractions do not result in opening (dilation) and thinning of the cervix. Toward the end of pregnancy (32-34 weeks), Braxton Hicks contractions can happen more often and may become stronger. These contractions are sometimes difficult to tell apart from true labor because they can be very uncomfortable. You should not feel embarrassed if you go to the hospital with false labor. Sometimes, the only way to tell if you are in true labor is for your health care provider to look for changes in the cervix. The health care provider   will do a physical exam and may  monitor your contractions. If you are not in true labor, the exam should show that your cervix is not dilating and your water has not broken. If there are no other health problems associated with your pregnancy, it is completely safe for you to be sent home with false labor. You may continue to have Braxton Hicks contractions until you go into true labor. How to tell the difference between true labor and false labor True labor  Contractions last 30-70 seconds.  Contractions become very regular.  Discomfort is usually felt in the top of the uterus, and it spreads to the lower abdomen and low back.  Contractions do not go away with walking.  Contractions usually become more intense and increase in frequency.  The cervix dilates and gets thinner. False labor  Contractions are usually shorter and not as strong as true labor contractions.  Contractions are usually irregular.  Contractions are often felt in the front of the lower abdomen and in the groin.  Contractions may go away when you walk around or change positions while lying down.  Contractions get weaker and are shorter-lasting as time goes on.  The cervix usually does not dilate or become thin. Follow these instructions at home:   Take over-the-counter and prescription medicines only as told by your health care provider.  Keep up with your usual exercises and follow other instructions from your health care provider.  Eat and drink lightly if you think you are going into labor.  If Braxton Hicks contractions are making you uncomfortable: ? Change your position from lying down or resting to walking, or change from walking to resting. ? Sit and rest in a tub of warm water. ? Drink enough fluid to keep your urine pale yellow. Dehydration may cause these contractions. ? Do slow and deep breathing several times an hour.  Keep all follow-up prenatal visits as told by your health care provider. This is important. Contact a  health care provider if:  You have a fever.  You have continuous pain in your abdomen. Get help right away if:  Your contractions become stronger, more regular, and closer together.  You have fluid leaking or gushing from your vagina.  You pass blood-tinged mucus (bloody show).  You have bleeding from your vagina.  You have low back pain that you never had before.  You feel your baby's head pushing down and causing pelvic pressure.  Your baby is not moving inside you as much as it used to. Summary  Contractions that occur before labor are called Braxton Hicks contractions, false labor, or practice contractions.  Braxton Hicks contractions are usually shorter, weaker, farther apart, and less regular than true labor contractions. True labor contractions usually become progressively stronger and regular, and they become more frequent.  Manage discomfort from [facility] contractions by changing position, resting in a warm bath, drinking plenty of water, or practicing deep breathing. This information is not intended to replace advice given to you by your health care provider. Make sure you discuss any questions you have with your health care provider. Document Revised: 12/25/2016 Document Reviewed: 05/28/2016 Elsevier Patient Education  2020 [employer].  Contraception Choices Contraception, also called birth control, means things to use or ways to try not to get pregnant. Hormonal birth control This kind of birth control uses hormones. Here are some types of hormonal birth control:  A tube that is put under skin of the arm (implant). The tube  can stay in for as long as 3 years.  Shots to get every 3 months (injections).  Pills to take every day (birth control pills).  A patch to change 1 time each week for 3 weeks (birth control patch). After that, the patch is taken off for 1 week.  A ring to put in the vagina. The ring is left in for 3 weeks. Then it is taken out of the  vagina for 1 week. Then a new ring is put in.  Pills to take after unprotected sex (emergency birth control pills). Barrier birth control Here are some types of barrier birth control:  A thin covering that is put on the penis before sex (female condom). The covering is thrown away after sex.  A soft, loose covering that is put in the vagina before sex (female condom). The covering is thrown away after sex.  A rubber bowl that sits over the cervix (diaphragm). The bowl must be made for you. The bowl is put into the vagina before sex. The bowl is left in for 6-8 hours after sex. It is taken out within 24 hours.  A small, soft cup that fits over the cervix (cervical cap). The cup must be made for you. The cup can be left in for 6-8 hours after sex. It is taken out within 48 hours.  A sponge that is put into the vagina before sex. It must be left in for at least 6 hours after sex. It must be taken out within 30 hours. Then it is thrown away.  A chemical that kills or stops sperm from getting into the uterus (spermicide). It may be a pill, cream, jelly, or foam to put in the vagina. The chemical should be used at least 10-15 minutes before sex. IUD (intrauterine) birth control An IUD is a small, T-shaped piece of plastic. It is put inside the uterus. There are two kinds:  Hormone IUD. This kind can stay in for 3-5 years.  Copper IUD. This kind can stay in for 10 years. Permanent birth control Here are some types of permanent birth control:  Surgery to block the fallopian tubes.  Having an insert put into each fallopian tube.  Surgery to tie off the tubes that carry sperm (vasectomy). Natural planning birth control Here are some types of natural planning birth control:  Not having sex on the days the woman could get pregnant.  Using a calendar: ? To keep track of the length of each period. ? To find out what days pregnancy can happen. ? To plan to not have sex on days when pregnancy  can happen.  Watching for symptoms of ovulation and not having sex during ovulation. One way the woman can check for ovulation is to check her temperature.  Waiting to have sex until after ovulation. Summary  Contraception, also called birth control, means things to use or ways to try not to get pregnant.  Hormonal methods of birth control include implants, injections, pills, patches, vaginal rings, and emergency birth control pills.  Barrier methods of birth control can include female condoms, female condoms, diaphragms, cervical caps, sponges, and spermicides.  There are two types of IUD (intrauterine device) birth control. An IUD can be put in a woman's uterus to prevent pregnancy for 3-5 years.  Permanent sterilization can be done through a procedure for males, females, or both.  Natural planning methods involve not having sex on the days when the woman could get pregnant. This information is not  intended to replace advice given to you by your health care provider. Make sure you discuss any questions you have with your health care provider. Document Revised: 05/04/2018 Document Reviewed: 01/23/2016 Elsevier Patient Education  2020 Elsevier Inc.  AREA PEDIATRIC/FAMILY PRACTICE PHYSICIANS  [place] ([address]) . [facility] Melodie Bouillon, MD; Lum Babe, MD; Sheffield Slider, MD; Leveda Anna, MD; McDiarmid, MD; Jerene Bears, MD; Jennette Kettle, MD; Gwendolyn Grant, MD o [address] [place] [place], [place] [address] o [phone] o Mon-Fri 8:30-12:30, 1:30-5:00 o Providers come to see babies at [facility] o Accepting Medicaid . [facility] [place] o Limited providers who accept newborns: Docia Chuck, MD; Kateri Plummer, MD; Paulino Rily, MD o [address], [place], [place] [address] o [phone] o Mon-Fri 8:00-5:30 o Babies seen by providers at [facility] o Does NOT accept Medicaid o Please call early in hospitalization for appointment (limited  availability)  . [facility] Fatima Sanger, MD o [address], [place], [place] [address] o [phone] o Mon, Tue, Thur, Fri 8:30-5:00, Wed 10:00-7:00 (closed 1-2pm) o Babies seen by [facility] providers o Accepting Medicaid . Donnie Coffin - Pediatrician Fae Pippin, MD o [address], [place], [place] [address] o [phone] o Mon-Fri 8:30-5:00, Sat 8:30-12:00 o Provider comes to see babies at [facility] o Accepting Medicaid o Must have been referred from current patients or contacted office prior to delivery . [facility]) Leotis Pain, MD; Ave Filter, MD; Luna Fuse, MD; Kennedy Bucker, MD; Konrad Dolores, MD; Kathlene November, MD; Jenne Campus, MD; Lubertha South, MD; Wynetta Emery, MD; Duffy Rhody, MD; Gerre Couch, NP; Shirl Harris, NP o [address] [place] [place], [place], [place] [address] o [phone] o Mon, Tue, Thur, Fri 8:30-5:30, Wed 9:30-5:30, Sat 8:30-12:30 o Babies seen by [facility] providers o Accepting Medicaid o Only accepting infants of first-time parents or siblings of current patients [facility] discharge coordinator will make follow-up appointment . Cyril Mourning o [address], [place], [place]  [address] o [phone]   Fax - [phone] . [facility] o [address], [place], [place]  [address] o Phone - [phone]   Fax - [phone] . Lucio Edward o [address], [place], [place]  [address] o [phone]  [place] [phone]) . [facility] Jorge Mandril, MD; Alita Chyle, MD; Princella Ion, MD; MD; Earlene Plater, MD; Jamesetta Orleans, MD; Alvera Novel, MD; Clarene Duke, MD; Rana Snare, MD; Carmon Ginsberg, MD; Alinda Money, MD; Hosie Poisson, MD; Mayford Knife, MD o [address] [place], [place] [address] o [phone] o Mon-Fri 8:30-5:00 (extended evenings Mon-Thur as needed), Sat-Sun 10:00-1:00 o Providers come to see babies at [facility] o Accepting Medicaid for families of first-time babies and  families with all children in the household age 20 and under. Must register with office prior to making appointment (M-F only). [facility] Odella Aquas, NP; Lynelle Doctor, MD; Susann Givens, MD; [place], [place] o [address] [place], [place] [address] o [phone] o Mon-Fri 8:00-5:00 o Babies seen by providers at [facility] o Does NOT accept Medicaid/Commercial Insurance Only . [facility] [place] [facility]  Suzette Battiest, MD; Zachery Dauer, MD; Stefan Church, MD; Sabino Dick, MD; Quitman Livings, MD; Farris Has, MD; Gaynell Face, MD; Betha Loa, MD; Colon Flattery, MD; Clifton James, MD o [address] [place] [place], [place] [address] o [phone] o Mon-Fri 8:30-5:30, Sat (Oct.-Mar.) 9:00-1:00 o Babies seen by providers at [facility] o Accepting [facility] [phone]) . [facility] o  Azucena Kuba, MD; Sheliah Hatch, MD o [address], [place], [place] [address] o [phone] o Mon-Fri 8:30-5:00, Sat 8:30-12:00 o Providers come to see babies at [facility] o Does NOT accept Medicaid . [facility] Cindy Hazy, [place]; [place], MD; [place], [place]; Wynelle Link, MD; Azucena Cecil, MD o [address], [place], [place] [address] o [phone] o Mon-Fri 8:00-5:00 o Babies seen by providers at [facility] o Does NOT accept Medicaid o Only accepting babies of parents who are patients o Please call early in hospitalization for appointment (limited availability) . [facility] Lamar Benes, MD; Abran Cantor, MD; Early Osmond, MD; Cherre Huger, NP; [name], MD; Dwan Bolt, MD; Jarold Motto, NP; Dario Guardian, MD; Talmage Nap, MD; Maisie Fus, MD; Pricilla Holm, MD; Tama High, MD o [address] [place], [place], [place] [address] o [phone] o Mon-Fri 8:00-5:00, Sat 9:00-12:00 o Providers come to see babies at [facility] o Does NOT accept [facility] [phone]) . [facility] o Limited providers accepting new  patients: Drema Pry, NP; Kirby, PA o [address] [place], [place] [address] o [phone] o Mon-Fri 8:00-5:00 o Babies seen by providers at [facility] o Does NOT accept Medicaid o Only accepting babies of parents who are patients o Please call early in hospitalization for appointment (limited availability) . [facility] Luan Pulling, MD; Nash Dimmer, MD o [address] [place], [place] [address] o [phone] (press 1 to schedule appointment) o Mon-Fri 8:00-5:00 o Providers come to see babies at [facility] o Does NOT accept Medicaid . [facility] Cristino Martes, MD o [address] [place], [place] [address] o [phone] o Mon-Fri 8:30-5:00 (lunch 12:30-1:00), extended hours by appointment only Wed 5:00-6:30 o Babies seen by [facility] providers o Accepting Medicaid . [facility] Abbot, MD; [place], MD; Hassan Rowan, MD o [address] [place], [place], [place] [address] o [phone] o Mon-Fri 8:00-5:00 o Babies seen by [facility] providers o Does NOT accept Medicaid . Nature conservation officer at [place] [address] Elsworth Soho, MD; Durene Cal, MD; Stuart, DO o [address] [place], [place] [address] o [phone] o Mon-Fri 8:00-5:00 o Babies seen by [facility] providers o Does NOT accept Medicaid . [facility] o [place], [place]; [place], [place]; Sheridan, NP; Avis Epley, MD; Vonna Kotyk, MD; Clance Boll, MD; Stevphen Rochester, NP; Arvilla Market, NP; Ann Maki, NP; Otis Dials, NP; Vaughan Basta, MD; Atkinson, MD o [address] [place], [place] [address] o [phone] o Mon-Fri 8:30-5:00, Sat 10:00-1:00 o Providers come to see babies at [facility] o Does NOT accept Medicaid o Free prenatal information session Tuesdays at 4:45pm . [facility] Luna Kitchens, MD; [place], [place]; [place], [place]; [place], [place] o [address] [place] [address] o [phone] o Mon-Fri 7:30-5:30 o Babies seen by [facility] providers . [facility]  Children's Doctor o [address], [place], [place]  [address] o [phone]   Fax - [phone]  [place] [phone] & [phone]) . [facility] Alphonsa Overall, MD o [address] [place], [place] [address] o [phone] o Mon-Thur 8:00-6:00 o Providers come to see babies at [facility] o Accepting Medicaid . [facility] Zenon Mayo, NP; Cyndia Bent, MD; [place], [place]; [place], [place] o [address] [place], [place] [address] o [phone] o Mon-Thur 7:30-7:30, Fri 7:30-4:30 o Babies seen by [facility] providers o Accepting Medicaid . [facility] Cheryle Horsfall, MD; Janene Harvey, NP; Vonita Moss, MD o [address], [place], [place] [address] o [phone] o  Mon-Fri 8:30-5:00, Sat 8:30-12:00 o Providers come to see babies at [facility] o Accepting Medicaid o Must have "Meet & Greet" appointment at office prior to delivery . [facility] [place] [facility] [place] Llana Alimento McCord, MD; Earlene PlaterWallace, MD; Lucretia RoersWood, MD o [address], [place], [place] [address] o [phone] o Mon-Wed 8:00-6:00, Thur-Fri 8:00-5:00, Sat 9:00-12:00 o Providers come to see babies at [facility] o Does NOT accept Medicaid o Only accepting siblings of current patients . [facility]  o [address], [place], [place]  [address] o [phone]   Fax - [phone] . [facility] Jeanette o [phone], [place], [place]  [address] o [phone]   Fax - [phone]  [place] [phone] & [phone]) . [facility] o Church Hillirigliano, DO; Kelly RidgeMatthews, DO o [address] [place], [place] [address] o [phone] o Mon-Fri 7:00-5:00 o Babies seen by [facility] providers o Does NOT accept Medicaid . [facility] Ellis Savageo Briscoe, MD; [place], [place]; [place], [place] o [address], [place], [place] [address] o [phone] o Mon-Fri 8:00-5:00 o Babies seen by [facility] providers o Accepting Medicaid . [facility] - [address] Franne Fortso Boyd, MD; [place], [place]; GeneseoJones, NP; [place], [place] o [address], [place], [place] [address] o [phone] o Mon-Fri 8:00-5:00 o Babies seen by providers at [facility] o Accepting [facility]  [place] [phone]) . [facility] [place] Roesigero Wendling, [place] o [address] [place], [place] [address] o [phone] o Mon-Fri 8:00-5:00 o Babies seen by [facility] providers o Does NOT accept Medicaid o Limited availability, please call early in hospitalization to schedule follow-up . [facility] Jolee Ewingo Calderon, PA; Eddie Candleummings, MD; CrisfieldDillard, MD; [place], [place]; Constance Goltzlson, MD; [place], [place] o [phone] [facility] [address], [place], [place] [address] o [phone] o Mon-Fri 8:30-5:00, Sat 9:00-12:00 o Babies seen by providers at [facility] o Accepting Medicaid o Please register online then schedule online or call office o [email or web address] . [facility]) Samuella Bruino Hunter, NP; Lucianne MussKumar, MD; Lanier ClamMartin Rogers, PA o [address], [place], [place] [address] o [phone] o Mon-Fri 8:00-5:00 o Babies seen by providers at [facility] o Accepting Medicaid . [facility] ([facility]) Sharin Monso Willow, MD; Reed BreechKristi Fleenor, NP; Shelva MajesticWest, MD o [address], [place], [place] [address] o [phone] o Mon-Fri 8:00-5:30, Sat&Sun by appointment (phones open at 8:30) o Babies seen by [facility] providers o Accepting Medicaid o Must be a first-time baby or sibling of current patient . [facility]  o [address], [address], [place], [place]  [id number] o [phone]   Fax - [phone]  [place] [phone] & [phone]) . [facility] o [place], [place]; [place], [place]; Dimple Caseyice, MD; [place], [place]; Carolyne FiscalSpry, MD o [address] [place], [place] [address] o [phone] o Mon-Thur 8:00-7:00, Fri 8:00-5:00, Sat 8:00-12:00, Sun 9:00-12:00 o Babies [facility] by [facility] providers o Accepting Medicaid . [facility] o Coe-Goins, MD; Gaynell FaceMarshall, MD; [facility], MD o [address], [place], [place] [address] o [phone] o Mon-Thur 8:00-5:00 o Babies seen by [facility] at [facility] o Accepting Medicaid . [facility] -  Family Medicine at [facility], MD; Coe-Goins, MD; Amedeo Plenty, MD; Bobby Rumpf, MD; List, MD; Lavonia Drafts, MD; Ruthann Cancer, MD; Selinda Eon, MD; Audie Box, MD; Jim Like, MD; Christie Nottingham, MD; Hubbard Hartshorn, MD; Modena Nunnery, MD o [address] [place], [place] [address] o [phone] o Mon-Fri 8:00-5:30, Sat (Oct.-Mar.) 9:00-1:00 o Babies seen by providers at [facility] o Accepting Medicaid o Must fill out new patient packet, available online at [email or web address] . [place] ([facility]) Barnabas Lister, NP; Kenton Kingfisher, NP; Claiborne Billings, NP; Rolla Plate, MD; [place], [place]; Carola Rhine, MD; Tyron Russell, MD; Delia Chimes, NP o [address], [place], [place] [address] o [phone] o Mon-Thur 8:00-5:30, Fri 8:00-5:00 o Babies seen by providers at [place] [phone]) . [place], [place]; [place], MD; Dennard Schaumann, MD; [place], [place] o [address] [place], [place] [address] o [phone] o Mon-Fri 8:00-5:00 o Babies seen by providers at [place] [phone]) . [place] [facility] [place], [place]; Olen Pel, MD; [place], [place], [place], [place] [address] o [phone] o Mon-Fri 8:00-5:00 o Babies seen by providers at [facility] o Does NOT accept Medicaid o Limited  appointment availability, please call early in hospitalization  . Therapist, music at [place], [place]; [place], [address], [place], [place] [address] o [phone] o Mon-Fri 8:00-5:00 o Babies seen by [facility] providers o Does NOT accept Medicaid . [facility] Su [place], MD; Guy Sandifer, MD; [place], [place]; [place], [address], [place], [place] [address] o [phone] o Mon-Fri 8:00-5:00 o After hours clinic [facility] [place], [place] [address]) [phone] Mon-Fri 5:00-8:00, Sat 12:00-6:00, Sun 10:00-4:00 o Babies seen by [facility] providers o Accepting Medicaid . [place] at [facility] o [address], [place], [place]  [address] o [phone]   Fax - [phone]  [place] [phone]) . Therapist, music at [facility], MD o [address] [place], [place], [place] [address] o [phone] o Mon-Fri 8:00-5:00 o Babies seen by [facility] providers o Does NOT accept Medicaid . [place] ([place] [facility] [place]) Bing Neighbors, MD o [address] [place], [place],  [address] o [phone] o Mon-Thur 8:00-7:00, Fri 8:00-5:00, Sat 8:00-12:00 o Babies seen by providers at [facility] o Accepting Medicaid - but does not have vaccinations in office (must be received elsewhere) o Limited availability, please call early in hospitalization  [place] ([phone]) . [place], [place] [address], [place] [address] o [phone]  Fax [phone]

## 2019-05-31 LAB — CBC
Hematocrit: 31 % — ABNORMAL LOW (ref 34.0–46.6)
Hemoglobin: 10.4 g/dL — ABNORMAL LOW (ref 11.1–15.9)
MCH: 31.8 pg (ref 26.6–33.0)
MCHC: 33.5 g/dL (ref 31.5–35.7)
MCV: 95 fL (ref 79–97)
Platelets: 202 10*3/uL (ref 150–450)
RBC: 3.27 x10E6/uL — ABNORMAL LOW (ref 3.77–5.28)
RDW: 12.4 % (ref 11.7–15.4)
WBC: 11.7 10*3/uL — ABNORMAL HIGH (ref 3.4–10.8)

## 2019-05-31 LAB — GLUCOSE TOLERANCE, 2 HOURS W/ 1HR
Glucose, 1 hour: 90 mg/dL (ref 65–179)
Glucose, 2 hour: 89 mg/dL (ref 65–152)
Glucose, Fasting: 64 mg/dL — ABNORMAL LOW (ref 65–91)

## 2019-05-31 LAB — RPR: RPR Ser Ql: NONREACTIVE

## 2019-05-31 LAB — HIV ANTIBODY (ROUTINE TESTING W REFLEX): HIV Screen 4th Generation wRfx: NONREACTIVE

## 2019-06-13 ENCOUNTER — Ambulatory Visit: Payer: Medicaid Other | Admitting: *Deleted

## 2019-06-13 ENCOUNTER — Other Ambulatory Visit: Payer: Self-pay

## 2019-06-13 ENCOUNTER — Ambulatory Visit (HOSPITAL_COMMUNITY): Payer: Medicaid Other | Attending: Obstetrics

## 2019-06-13 DIAGNOSIS — Z3400 Encounter for supervision of normal first pregnancy, unspecified trimester: Secondary | ICD-10-CM | POA: Diagnosis present

## 2019-06-13 DIAGNOSIS — Z148 Genetic carrier of other disease: Secondary | ICD-10-CM | POA: Diagnosis not present

## 2019-06-13 DIAGNOSIS — O358XX0 Maternal care for other (suspected) fetal abnormality and damage, not applicable or unspecified: Secondary | ICD-10-CM | POA: Diagnosis not present

## 2019-06-13 DIAGNOSIS — Z3687 Encounter for antenatal screening for uncertain dates: Secondary | ICD-10-CM

## 2019-06-13 DIAGNOSIS — Z3A30 30 weeks gestation of pregnancy: Secondary | ICD-10-CM

## 2019-06-13 DIAGNOSIS — Z362 Encounter for other antenatal screening follow-up: Secondary | ICD-10-CM

## 2019-06-13 DIAGNOSIS — R898 Other abnormal findings in specimens from other organs, systems and tissues: Secondary | ICD-10-CM | POA: Diagnosis present

## 2019-06-27 ENCOUNTER — Ambulatory Visit (INDEPENDENT_AMBULATORY_CARE_PROVIDER_SITE_OTHER): Payer: Medicaid Other | Admitting: Obstetrics and Gynecology

## 2019-06-27 ENCOUNTER — Other Ambulatory Visit: Payer: Self-pay

## 2019-06-27 VITALS — BP 106/71 | HR 94 | Wt 142.0 lb

## 2019-06-27 DIAGNOSIS — O99019 Anemia complicating pregnancy, unspecified trimester: Secondary | ICD-10-CM | POA: Insufficient documentation

## 2019-06-27 DIAGNOSIS — D649 Anemia, unspecified: Secondary | ICD-10-CM

## 2019-06-27 DIAGNOSIS — O98813 Other maternal infectious and parasitic diseases complicating pregnancy, third trimester: Secondary | ICD-10-CM

## 2019-06-27 DIAGNOSIS — A530 Latent syphilis, unspecified as early or late: Secondary | ICD-10-CM

## 2019-06-27 DIAGNOSIS — A749 Chlamydial infection, unspecified: Secondary | ICD-10-CM

## 2019-06-27 DIAGNOSIS — O99013 Anemia complicating pregnancy, third trimester: Secondary | ICD-10-CM

## 2019-06-27 DIAGNOSIS — O98113 Syphilis complicating pregnancy, third trimester: Secondary | ICD-10-CM

## 2019-06-27 DIAGNOSIS — R300 Dysuria: Secondary | ICD-10-CM

## 2019-06-27 DIAGNOSIS — Z3400 Encounter for supervision of normal first pregnancy, unspecified trimester: Secondary | ICD-10-CM

## 2019-06-27 DIAGNOSIS — Z3A32 32 weeks gestation of pregnancy: Secondary | ICD-10-CM

## 2019-06-27 LAB — POCT URINALYSIS DIPSTICK
Bilirubin, UA: NEGATIVE
Blood, UA: NEGATIVE
Glucose, UA: NEGATIVE
Ketones, UA: NEGATIVE
Leukocytes, UA: NEGATIVE
Nitrite, UA: NEGATIVE
Protein, UA: NEGATIVE
Spec Grav, UA: 1.02 (ref 1.010–1.025)
Urobilinogen, UA: 0.2 E.U./dL
pH, UA: 5 (ref 5.0–8.0)

## 2019-06-27 NOTE — Progress Notes (Signed)
   PRENATAL VISIT NOTE  Subjective:  Heather Hoffman is a 19 y.o. G1P0 at [redacted]w[redacted]d being seen today for ongoing prenatal care.  She is currently monitored for the following issues for this low-risk pregnancy and has Chlamydia; UTI (urinary tract infection); Supervision of normal first pregnancy, antepartum; Chlamydia infection during pregnancy, antepartum; Adjustment insomnia; Intractable migraine with aura without status migrainosus; Severe episode of recurrent major depressive disorder, without psychotic features (HCC); Underweight; Abnormal genetic test; Positive RPR test; and Anemia in pregnancy on their problem list.  Patient reports backache.  Contractions: Irritability. Vag. Bleeding: None.  Movement: Present. Denies leaking of fluid.   The following portions of the patient's history were reviewed and updated as appropriate: allergies, current medications, past family history, past medical history, past social history, past surgical history and problem list.   Objective:   Vitals:   06/27/19 1017  BP: 106/71  Pulse: 94  Weight: 142 lb (64.4 kg)    Fetal Status: Fetal Heart Rate (bpm): 137 Fundal Height: 32 cm Movement: Present     General:  Alert, oriented and cooperative. Patient is in no acute distress.  Skin: Skin is warm and dry. No rash noted.   Cardiovascular: Normal heart rate noted  Respiratory: Normal respiratory effort, no problems with respiration noted  Abdomen: Soft, gravid, appropriate for gestational age.  Pain/Pressure: Present     Pelvic: Cervical exam deferred        Extremities: Normal range of motion.  Edema: None  Mental Status: Normal mood and affect. Normal behavior. Normal judgment and thought content.   Assessment and Plan:  Pregnancy: G1P0 at [redacted]w[redacted]d  1. Positive RPR test  02/02/19: Quant 1:1 T PAL non reactive RPR 5/4: Non reactive    2. Supervision of normal first pregnancy, antepartum  Doing well Discussed [facility] baby website for classes.    3. Chlamydia infection during pregnancy, antepartum  TOC 2/16: Negative   4. Dysuria  - Backache + - POCT Urinalysis Dipstick - Culture, OB Urine  5. Anemia during pregnancy in third trimester  10.4, will treat if less than 10.0   There are no diagnoses linked to this encounter. Preterm labor symptoms and general obstetric precautions including but not limited to vaginal bleeding, contractions, leaking of fluid and fetal movement were reviewed in detail with the patient. Please refer to After Visit Summary for other counseling recommendations.   No follow-ups on file.  Future Appointments  Date Time Provider Department Center  07/14/2019 10:45 AM Levie Heritage, DO [facility] None    Venia Carbon, NP

## 2019-06-30 LAB — URINE CULTURE, OB REFLEX

## 2019-06-30 LAB — CULTURE, OB URINE

## 2019-07-14 ENCOUNTER — Ambulatory Visit (INDEPENDENT_AMBULATORY_CARE_PROVIDER_SITE_OTHER): Payer: Medicaid Other | Admitting: Family Medicine

## 2019-07-14 ENCOUNTER — Other Ambulatory Visit: Payer: Self-pay

## 2019-07-14 VITALS — BP 112/63 | HR 78 | Wt 146.0 lb

## 2019-07-14 DIAGNOSIS — Z3403 Encounter for supervision of normal first pregnancy, third trimester: Secondary | ICD-10-CM

## 2019-07-14 DIAGNOSIS — Z8619 Personal history of other infectious and parasitic diseases: Secondary | ICD-10-CM

## 2019-07-14 DIAGNOSIS — Z3400 Encounter for supervision of normal first pregnancy, unspecified trimester: Secondary | ICD-10-CM

## 2019-07-14 DIAGNOSIS — A530 Latent syphilis, unspecified as early or late: Secondary | ICD-10-CM

## 2019-07-14 NOTE — Progress Notes (Signed)
   PRENATAL VISIT NOTE  Subjective:  Heather Hoffman is a 19 y.o. G1P0 at [redacted]w[redacted]d being seen today for ongoing prenatal care.  She is currently monitored for the following issues for this low-risk pregnancy and has Chlamydia; UTI (urinary tract infection); Supervision of normal first pregnancy, antepartum; Chlamydia infection during pregnancy, antepartum; Adjustment insomnia; Intractable migraine with aura without status migrainosus; Severe episode of recurrent major depressive disorder, without psychotic features (HCC); Underweight; Abnormal genetic test; Positive RPR test; and Anemia in pregnancy on their problem list.  Patient reports occasional contractions.  Contractions: Irregular. Vag. Bleeding: None.  Movement: Present. Denies leaking of fluid.   The following portions of the patient's history were reviewed and updated as appropriate: allergies, current medications, past family history, past medical history, past social history, past surgical history and problem list.   Objective:   Vitals:   07/14/19 1038  BP: 112/63  Pulse: 78  Weight: 146 lb (66.2 kg)    Fetal Status: Fetal Heart Rate (bpm): 144 Fundal Height: 34 cm Movement: Present     General:  Alert, oriented and cooperative. Patient is in no acute distress.  Skin: Skin is warm and dry. No rash noted.   Cardiovascular: Normal heart rate noted  Respiratory: Normal respiratory effort, no problems with respiration noted  Abdomen: Soft, gravid, appropriate for gestational age.  Pain/Pressure: Present     Pelvic: Cervical exam deferred        Extremities: Normal range of motion.  Edema: None  Mental Status: Normal mood and affect. Normal behavior. Normal judgment and thought content.   Assessment and Plan:  Pregnancy: G1P0 at [redacted]w[redacted]d 1. Supervision of normal first pregnancy, antepartum FHT and FH normal  2. Positive RPR test Subsequent testing normal  3. History of chlamydia infection TOC neg. Recheck with 36 week  culture  Preterm labor symptoms and general obstetric precautions including but not limited to vaginal bleeding, contractions, leaking of fluid and fetal movement were reviewed in detail with the patient. Please refer to After Visit Summary for other counseling recommendations.   Return in about 2 weeks (around 07/28/2019) for OB f/u, GBS, In Office.  No future appointments.  Levie Heritage, DO

## 2019-07-28 ENCOUNTER — Other Ambulatory Visit (HOSPITAL_COMMUNITY)
Admission: RE | Admit: 2019-07-28 | Discharge: 2019-07-28 | Disposition: A | Discharge status: Home / Self Care | Payer: Medicaid Other | Source: Ambulatory Visit | Attending: Family Medicine | Admitting: Family Medicine

## 2019-07-28 ENCOUNTER — Other Ambulatory Visit: Payer: Self-pay

## 2019-07-28 ENCOUNTER — Ambulatory Visit (INDEPENDENT_AMBULATORY_CARE_PROVIDER_SITE_OTHER): Payer: Medicaid Other | Admitting: Family Medicine

## 2019-07-28 VITALS — BP 113/62 | HR 71 | Wt 148.1 lb

## 2019-07-28 DIAGNOSIS — A530 Latent syphilis, unspecified as early or late: Secondary | ICD-10-CM

## 2019-07-28 DIAGNOSIS — Z3400 Encounter for supervision of normal first pregnancy, unspecified trimester: Secondary | ICD-10-CM

## 2019-07-28 DIAGNOSIS — D649 Anemia, unspecified: Secondary | ICD-10-CM

## 2019-07-28 DIAGNOSIS — O99013 Anemia complicating pregnancy, third trimester: Secondary | ICD-10-CM

## 2019-07-28 DIAGNOSIS — Z8619 Personal history of other infectious and parasitic diseases: Secondary | ICD-10-CM

## 2019-07-28 DIAGNOSIS — Z3A36 36 weeks gestation of pregnancy: Secondary | ICD-10-CM

## 2019-07-28 DIAGNOSIS — O98113 Syphilis complicating pregnancy, third trimester: Secondary | ICD-10-CM

## 2019-07-28 NOTE — Progress Notes (Signed)
   PRENATAL VISIT NOTE  Subjective:  Heather Hoffman is a 19 y.o. G1P0 at [redacted]w[redacted]d being seen today for ongoing prenatal care.  She is currently monitored for the following issues for this low-risk pregnancy and has Chlamydia; UTI (urinary tract infection); Supervision of normal first pregnancy, antepartum; Chlamydia infection during pregnancy, antepartum; Adjustment insomnia; Intractable migraine with aura without status migrainosus; Severe episode of recurrent major depressive disorder, without psychotic features (HCC); Underweight; Abnormal genetic test; Positive RPR test; and Anemia in pregnancy on their problem list.  Patient reports occasional contractions.  Contractions: Irregular. Vag. Bleeding: None.  Movement: Present. Denies leaking of fluid.   The following portions of the patient's history were reviewed and updated as appropriate: allergies, current medications, past family history, past medical history, past social history, past surgical history and problem list.   Objective:   Vitals:   07/28/19 1040  BP: 113/62  Pulse: 71  Weight: 148 lb 1.9 oz (67.2 kg)    Fetal Status: Fetal Heart Rate (bpm): 131   Movement: Present     General:  Alert, oriented and cooperative. Patient is in no acute distress.  Skin: Skin is warm and dry. No rash noted.   Cardiovascular: Normal heart rate noted  Respiratory: Normal respiratory effort, no problems with respiration noted  Abdomen: Soft, gravid, appropriate for gestational age.  Pain/Pressure: Present     Pelvic: Cervical exam deferred        Extremities: Normal range of motion.  Edema: None  Mental Status: Normal mood and affect. Normal behavior. Normal judgment and thought content.   Assessment and Plan:  Pregnancy: G1P0 at [redacted]w[redacted]d 1. Supervision of normal first pregnancy, antepartum FHT and FH normal. Cultures obtained - Culture, beta strep (group b only) - GC/Chlamydia probe amp ([place])not at [facility]  2. Positive RPR  test Subsequent normal testing  3. History of chlamydia infection In early pregnancy. TOC neg. Recheck today  4. Anemia during pregnancy in third trimester  Preterm labor symptoms and general obstetric precautions including but not limited to vaginal bleeding, contractions, leaking of fluid and fetal movement were reviewed in detail with the patient. Please refer to After Visit Summary for other counseling recommendations.   No follow-ups on file.  Future Appointments  Date Time Provider Department Center  08/03/2019  9:15 AM Levie Heritage, DO [facility] None  08/16/2019  2:00 PM Willodean Rosenthal, MD [facility] None    Levie Heritage, DO

## 2019-08-01 LAB — GC/CHLAMYDIA PROBE AMP (~~LOC~~) NOT AT ARMC
Chlamydia: NEGATIVE
Comment: NEGATIVE
Comment: NORMAL
Neisseria Gonorrhea: NEGATIVE

## 2019-08-01 LAB — CULTURE, BETA STREP (GROUP B ONLY): Strep Gp B Culture: NEGATIVE

## 2019-08-03 ENCOUNTER — Other Ambulatory Visit: Payer: Self-pay

## 2019-08-03 ENCOUNTER — Ambulatory Visit (INDEPENDENT_AMBULATORY_CARE_PROVIDER_SITE_OTHER): Payer: Medicaid Other | Admitting: Family Medicine

## 2019-08-03 VITALS — BP 121/71 | HR 74 | Wt 154.0 lb

## 2019-08-03 DIAGNOSIS — A530 Latent syphilis, unspecified as early or late: Secondary | ICD-10-CM | POA: Diagnosis not present

## 2019-08-03 DIAGNOSIS — Z3A37 37 weeks gestation of pregnancy: Secondary | ICD-10-CM | POA: Diagnosis not present

## 2019-08-03 DIAGNOSIS — O285 Abnormal chromosomal and genetic finding on antenatal screening of mother: Secondary | ICD-10-CM

## 2019-08-03 DIAGNOSIS — Z8619 Personal history of other infectious and parasitic diseases: Secondary | ICD-10-CM | POA: Diagnosis not present

## 2019-08-03 DIAGNOSIS — Z3400 Encounter for supervision of normal first pregnancy, unspecified trimester: Secondary | ICD-10-CM

## 2019-08-03 DIAGNOSIS — R898 Other abnormal findings in specimens from other organs, systems and tissues: Secondary | ICD-10-CM

## 2019-08-03 NOTE — Progress Notes (Signed)
   PRENATAL VISIT NOTE  Subjective:  Heather Hoffman is a 19 y.o. G1P0 at [redacted]w[redacted]d being seen today for ongoing prenatal care.  She is currently monitored for the following issues for this low-risk pregnancy and has Chlamydia; UTI (urinary tract infection); Supervision of normal first pregnancy, antepartum; Chlamydia infection during pregnancy, antepartum; Adjustment insomnia; Intractable migraine with aura without status migrainosus; Severe episode of recurrent major depressive disorder, without psychotic features (HCC); Underweight; Abnormal genetic test; Positive RPR test; and Anemia in pregnancy on their problem list.  Patient reports occasional contractions.  Contractions: Irregular. Vag. Bleeding: None.  Movement: Present. Denies leaking of fluid.   The following portions of the patient's history were reviewed and updated as appropriate: allergies, current medications, past family history, past medical history, past social history, past surgical history and problem list.   Objective:   Vitals:   08/03/19 0910  BP: 121/71  Pulse: 74  Weight: 154 lb (69.9 kg)    Fetal Status: Fetal Heart Rate (bpm): 124 Fundal Height: 38 cm Movement: Present  Presentation: Vertex  General:  Alert, oriented and cooperative. Patient is in no acute distress.  Skin: Skin is warm and dry. No rash noted.   Cardiovascular: Normal heart rate noted  Respiratory: Normal respiratory effort, no problems with respiration noted  Abdomen: Soft, gravid, appropriate for gestational age.  Pain/Pressure: Present     Pelvic: Cervical exam deferred        Extremities: Normal range of motion.  Edema: None  Mental Status: Normal mood and affect. Normal behavior. Normal judgment and thought content.   Assessment and Plan:  Pregnancy: G1P0 at [redacted]w[redacted]d 1. Supervision of normal first pregnancy, antepartum FHT and FH normal  2. History of chlamydia infection In early pregnancy. Treated. TOC neg. 36 week lab neg  3. Positive RPR  test Neg trep studies. Subsequent RPR neg.  4. Abnormal genetic test Increased SMA risk  Term labor symptoms and general obstetric precautions including but not limited to vaginal bleeding, contractions, leaking of fluid and fetal movement were reviewed in detail with the patient. Please refer to After Visit Summary for other counseling recommendations.   Return in about 1 week (around 08/10/2019) for OB f/u.  Future Appointments  Date Time Provider Department Center  08/16/2019  2:00 PM Willodean Rosenthal, MD [facility] None    Levie Heritage, DO

## 2019-08-10 ENCOUNTER — Encounter: Payer: Medicaid Other | Admitting: Family Medicine

## 2019-08-16 ENCOUNTER — Encounter: Payer: Medicaid Other | Admitting: Obstetrics & Gynecology

## 2019-09-07 ENCOUNTER — Ambulatory Visit: Payer: Medicaid Other | Admitting: Family Medicine

## 2020-11-19 ENCOUNTER — Encounter: Payer: Medicaid Other | Admitting: Advanced Practice Midwife

## 2021-01-01 ENCOUNTER — Telehealth: Payer: Self-pay

## 2021-01-01 NOTE — Telephone Encounter (Signed)
Pt called stating she is having some light spotting. Pt denies heavy bleeding and pain. Pt advised to go to [facility] at [facility] in [place] if she starts bleeding heavy like a period. Understanding was voiced. Kesler Wickham l Shahrzad Koble, CMA

## 2021-01-28 ENCOUNTER — Inpatient Hospital Stay (HOSPITAL_BASED_OUTPATIENT_CLINIC_OR_DEPARTMENT_OTHER): Payer: Medicaid Other

## 2021-01-28 ENCOUNTER — Encounter (HOSPITAL_COMMUNITY): Payer: Self-pay | Admitting: Obstetrics and Gynecology

## 2021-01-28 ENCOUNTER — Other Ambulatory Visit: Payer: Self-pay

## 2021-01-28 ENCOUNTER — Inpatient Hospital Stay (HOSPITAL_COMMUNITY)
Admission: AD | Admit: 2021-01-28 | Discharge: 2021-01-28 | Disposition: A | Discharge status: Home / Self Care | Payer: Medicaid Other | Attending: Obstetrics and Gynecology | Admitting: Obstetrics and Gynecology

## 2021-01-28 DIAGNOSIS — Z3A20 20 weeks gestation of pregnancy: Secondary | ICD-10-CM

## 2021-01-28 DIAGNOSIS — Z6710 Type A blood, Rh positive: Secondary | ICD-10-CM | POA: Insufficient documentation

## 2021-01-28 DIAGNOSIS — Z3492 Encounter for supervision of normal pregnancy, unspecified, second trimester: Secondary | ICD-10-CM

## 2021-01-28 DIAGNOSIS — O4692 Antepartum hemorrhage, unspecified, second trimester: Secondary | ICD-10-CM

## 2021-01-28 DIAGNOSIS — O209 Hemorrhage in early pregnancy, unspecified: Secondary | ICD-10-CM | POA: Diagnosis not present

## 2021-01-28 LAB — URINALYSIS, ROUTINE W REFLEX MICROSCOPIC
Bilirubin Urine: NEGATIVE
Glucose, UA: NEGATIVE mg/dL
Ketones, ur: NEGATIVE mg/dL
Leukocytes,Ua: NEGATIVE
Nitrite: NEGATIVE
Protein, ur: NEGATIVE mg/dL
Specific Gravity, Urine: 1.02 (ref 1.005–1.030)
pH: 7 (ref 5.0–8.0)

## 2021-01-28 LAB — CBC
HCT: 31.9 % — ABNORMAL LOW (ref 36.0–46.0)
Hemoglobin: 10.8 g/dL — ABNORMAL LOW (ref 12.0–15.0)
MCH: 32.8 pg (ref 26.0–34.0)
MCHC: 33.9 g/dL (ref 30.0–36.0)
MCV: 97 fL (ref 80.0–100.0)
Platelets: 201 10*3/uL (ref 150–400)
RBC: 3.29 MIL/uL — ABNORMAL LOW (ref 3.87–5.11)
RDW: 13.3 % (ref 11.5–15.5)
WBC: 13.2 10*3/uL — ABNORMAL HIGH (ref 4.0–10.5)
nRBC: 0 % (ref 0.0–0.2)

## 2021-01-28 LAB — URINALYSIS, MICROSCOPIC (REFLEX)

## 2021-01-28 LAB — WET PREP, GENITAL
Sperm: NONE SEEN
Trich, Wet Prep: NONE SEEN
WBC, Wet Prep HPF POC: >=10 — AB (ref <10)
Yeast Wet Prep HPF POC: NONE SEEN

## 2021-01-28 MED ORDER — PREPLUS 27-1 MG PO TABS: 1.0000 | ORAL_TABLET | Freq: Every day | ORAL | 13 refills | Status: AC

## 2021-01-28 NOTE — MAU Note (Signed)
has been feeling kicks for like a wk. Got up from her nap and there was blood in her underwear.  Had spotting a couple wks ago, this is more than that. Denies pain.  Has not started pnc yet, first appt is on Friday.

## 2021-01-28 NOTE — MAU Provider Note (Signed)
History     CSN: [id number]  Arrival date and time: 01/28/21 1555   Event Date/Time   First Provider Initiated Contact with Patient 01/28/21 1653      Chief Complaint  Patient presents with   Vaginal Bleeding   HPI Heather Hoffman is a 21 y.o. G2P1001 at 45w3dwho presents to MAU with chief complaint of vaginal bleeding. Patient first noticed spotting a couple weeks ago. Then she visualized blood in her underwear earlier today. She is not wearing a pad. She is not saturating her clothing. She denies pain, dysuria, fever or recent illness. She is remote from sexual intercourse.  OB History     Gravida  2   Para  1   Term  1   Preterm      AB      Living  1      SAB      IAB      Ectopic      Multiple      Live Births              Past Medical History:  Diagnosis Date   Medical history non-contributory     Past Surgical History:  Procedure Laterality Date   MOUTH SURGERY      Family History  Problem Relation Age of Onset   Hypertension Mother    Hypertension Maternal Grandfather     Social History   Tobacco Use   Smoking status: Never   Smokeless tobacco: Never  Vaping Use   Vaping Use: Former   Devices: stopped after finding out she was pregnant  Substance Use Topics   Alcohol use: Not Currently    Comment: stopped after finding out she was pregnant   Drug use: Yes    Types: Marijuana    Allergies: No Known Allergies  Medications Prior to Admission  Medication Sig Dispense Refill Last Dose   AMBULATORY NON FORMULARY MEDICATION 1 Device by Other route once a week. Blood Pressure Cuff/Medium  Monitored Regularly at home ICD 10: Z34.90 LROB (Patient not taking: Reported on 08/03/2019) 1 kit 0    Pediatric Multiple Vit-C-FA (FLINSTONES GUMMIES OMEGA-3 DHA PO) Take by mouth.       Review of Systems  Genitourinary:  Positive for vaginal bleeding.  All other systems reviewed and are negative. Physical Exam   Blood pressure (!) 113/56,  pulse 88, temperature 98.9 F (37.2 C), temperature source Oral, resp. rate 16, height 5' 6"  (1.676 m), weight 60.9 kg, last menstrual period 09/07/2020, SpO2 100 %, unknown if currently breastfeeding.  Physical Exam Vitals and nursing note reviewed. Exam conducted with a chaperone present.  Constitutional:      Appearance: Normal appearance.  Cardiovascular:     Rate and Rhythm: Normal rate and regular rhythm.     Pulses: Normal pulses.     Heart sounds: Normal heart sounds.  Pulmonary:     Effort: Pulmonary effort is normal.     Breath sounds: Normal breath sounds.  Abdominal:     Tenderness: There is no right CVA tenderness or left CVA tenderness.     Comments: Gravid  Skin:    Capillary Refill: Capillary refill takes less than 2 seconds.  Neurological:     Mental Status: She is alert and oriented to person, place, and time.  Psychiatric:        Mood and Affect: Mood normal.        Behavior: Behavior normal.  Thought Content: Thought content normal.        Judgment: Judgment normal.    MAU Course  Procedures  --Clue cells on swab, not other [place]ms[place] Criteria. Will not treat for Bacterial Vaginosis --Tech difficulty sending images from sonographer to MFM. Preemptive discussion with patient regarding concern for blood clot on unofficial report. Will call patient with abnormal results and forward report to Dr. Nehemiah Settle, who is seeing patient for New OB on Friday 01/31/2021  Orders Placed This Encounter  Procedures   Wet prep, genital   [place] MFM OB Limited   CBC   Urinalysis, Routine w reflex microscopic Urine, Clean Catch   Discharge patient   Patient Vitals for the past 24 hrs:  BP Temp Temp src Pulse Resp SpO2 Height Weight  01/28/21 1650 (!) 113/56 -- -- 88 -- -- -- --  01/28/21 1616 118/70 98.9 F (37.2 C) Oral 98 16 100 % 5' 6"  (1.676 m) 60.9 kg   Narrative & Impression  ----------------------------------------------------------------------  OBSTETRICS REPORT                        (Signed Final 01/29/2021 08:09 am) ---------------------------------------------------------------------- Patient Info  ID #:       [id number]                          D.O.B.:  02-24-2000 (20 yrs)  Name:       Heather Hoffman                    Visit Date: 01/28/2021 05:49 pm ---------------------------------------------------------------------- Performed By  Attending:        Tama High MD        Secondary Phy.:   Darlina Rumpf  Performed By:     Georgie Chard        [facility]                    [place]  Referred By:      [facility] MAU/Triage ---------------------------------------------------------------------- Orders  #  Description                           Code        Ordered By  1  [place] MFM OB LIMITED                     70017.49    Mallie Snooks ----------------------------------------------------------------------  #  Order #  Accession #                Episode #  1  [id number]                   [id number]                 [id number] ---------------------------------------------------------------------- Indications  [redacted] weeks gestation of pregnancy                Z3A.20  Vaginal bleeding in pregnancy, second          O46.92  trimester ---------------------------------------------------------------------- Fetal Evaluation  Num Of Fetuses:         1  Fetal Heart Rate(bpm):  155  Cardiac Activity:       Observed  Presentation:           Cephalic  Placenta:               Posterior  P. Cord Insertion:      Visualized  Amniotic Fluid  AFI FV:      Within normal limits                              Largest Pocket(cm)                              3.2 ---------------------------------------------------------------------- Biometry  LV:        6.8   mm ---------------------------------------------------------------------- OB History  Gravidity:    2         Term:   1        Prem:   0        SAB:   0  TOP:          0       Ectopic:  0        Living: 1 ---------------------------------------------------------------------- Gestational Age  LMP:           20w 3d        Date:  09/07/20                 EDD:   06/14/21  Best:          Heather Hoffman 3d     Det. By:  LMP  (09/07/20)          EDD:   06/14/21 ---------------------------------------------------------------------- Anatomy  Cranium:               Appears normal         Diaphragm:              Appears normal  Ventricles:            Appears normal         Stomach:                Appears normal, left                                                                        sided  Thoracic:              Appears normal  Kidneys:                Appear normal  Heart:                 Appears normal         Bladder:                Appears normal                         (4CH, axis, and                         situs) ---------------------------------------------------------------------- Cervix Uterus Adnexa  Cervix  Length:           3.34  cm.  Normal appearance by transabdominal scan. ---------------------------------------------------------------------- Impression  Patient was evaluated for c/o vaginal bleeding.  A limited ultrasound study was performed.  Amniotic fluid is  normal and good fetal activity seen.  An irregular echogenic  material over the internal os, measuring 4.2 x 3.3 cm was  seen.  This could represent blood clot. ---------------------------------------------------------------------- Recommendations  - Recommend fetal anatomy scan and transvaginal  ultrasound in 1 to 2 weeks.` ----------------------------------------------------------------------                  Tama High, MD Electronically Signed Final Report   01/29/2021 08:09 am    Assessment and Plan  --21  y.o.  G2P1001 at [redacted]w[redacted]d --[place]156 by Doppler --No acute findings --Vaginal spotting, blood type A POS --Pelvic rest advised --Discharge home in stable condition  SDarlina Rumpf CNM 01/30/2021, 7:41 AM

## 2021-01-29 LAB — GC/CHLAMYDIA PROBE AMP (~~LOC~~) NOT AT ARMC
Chlamydia: POSITIVE — AB
Comment: NEGATIVE
Comment: NORMAL
Neisseria Gonorrhea: NEGATIVE

## 2021-01-30 ENCOUNTER — Other Ambulatory Visit (INDEPENDENT_AMBULATORY_CARE_PROVIDER_SITE_OTHER): Payer: Medicaid Other | Admitting: Advanced Practice Midwife

## 2021-01-30 DIAGNOSIS — A749 Chlamydial infection, unspecified: Secondary | ICD-10-CM

## 2021-01-30 MED ORDER — AZITHROMYCIN 500 MG PO TABS: 1000.0000 mg | ORAL_TABLET | Freq: Once | ORAL | 1 refills | Status: AC

## 2021-01-31 ENCOUNTER — Encounter: Payer: Medicaid Other | Admitting: Family Medicine

## 2021-02-19 ENCOUNTER — Observation Stay (HOSPITAL_COMMUNITY)
Admission: AD | Admit: 2021-02-19 | Discharge: 2021-02-19 | Disposition: A | Discharge status: Home / Self Care | Payer: Medicaid Other | Attending: Obstetrics and Gynecology | Admitting: Obstetrics and Gynecology

## 2021-02-19 ENCOUNTER — Inpatient Hospital Stay (HOSPITAL_BASED_OUTPATIENT_CLINIC_OR_DEPARTMENT_OTHER): Payer: Medicaid Other

## 2021-02-19 ENCOUNTER — Other Ambulatory Visit: Payer: Self-pay

## 2021-02-19 ENCOUNTER — Encounter (HOSPITAL_COMMUNITY): Payer: Self-pay | Admitting: Obstetrics and Gynecology

## 2021-02-19 DIAGNOSIS — O4692 Antepartum hemorrhage, unspecified, second trimester: Secondary | ICD-10-CM | POA: Diagnosis present

## 2021-02-19 DIAGNOSIS — O42912 Preterm premature rupture of membranes, unspecified as to length of time between rupture and onset of labor, second trimester: Secondary | ICD-10-CM

## 2021-02-19 DIAGNOSIS — A749 Chlamydial infection, unspecified: Secondary | ICD-10-CM

## 2021-02-19 DIAGNOSIS — Z3A21 21 weeks gestation of pregnancy: Secondary | ICD-10-CM

## 2021-02-19 DIAGNOSIS — Z20822 Contact with and (suspected) exposure to covid-19: Secondary | ICD-10-CM | POA: Insufficient documentation

## 2021-02-19 DIAGNOSIS — Z8759 Personal history of other complications of pregnancy, childbirth and the puerperium: Secondary | ICD-10-CM | POA: Diagnosis present

## 2021-02-19 DIAGNOSIS — O98819 Other maternal infectious and parasitic diseases complicating pregnancy, unspecified trimester: Secondary | ICD-10-CM

## 2021-02-19 DIAGNOSIS — Z3689 Encounter for other specified antenatal screening: Secondary | ICD-10-CM | POA: Diagnosis not present

## 2021-02-19 DIAGNOSIS — Z3A23 23 weeks gestation of pregnancy: Secondary | ICD-10-CM | POA: Insufficient documentation

## 2021-02-19 DIAGNOSIS — O365920 Maternal care for other known or suspected poor fetal growth, second trimester, not applicable or unspecified: Secondary | ICD-10-CM | POA: Diagnosis not present

## 2021-02-19 DIAGNOSIS — O42919 Preterm premature rupture of membranes, unspecified as to length of time between rupture and onset of labor, unspecified trimester: Secondary | ICD-10-CM | POA: Diagnosis not present

## 2021-02-19 DIAGNOSIS — O0932 Supervision of pregnancy with insufficient antenatal care, second trimester: Secondary | ICD-10-CM | POA: Insufficient documentation

## 2021-02-19 LAB — URINALYSIS, ROUTINE W REFLEX MICROSCOPIC
Bilirubin Urine: NEGATIVE
Glucose, UA: NEGATIVE mg/dL
Ketones, ur: NEGATIVE mg/dL
Leukocytes,Ua: NEGATIVE
Nitrite: NEGATIVE
Protein, ur: NEGATIVE mg/dL
Specific Gravity, Urine: 1.023 (ref 1.005–1.030)
pH: 7 (ref 5.0–8.0)

## 2021-02-19 LAB — CBC
HCT: 33.4 % — ABNORMAL LOW (ref 36.0–46.0)
Hemoglobin: 10.9 g/dL — ABNORMAL LOW (ref 12.0–15.0)
MCH: 31.8 pg (ref 26.0–34.0)
MCHC: 32.6 g/dL (ref 30.0–36.0)
MCV: 97.4 fL (ref 80.0–100.0)
Platelets: 207 10*3/uL (ref 150–400)
RBC: 3.43 MIL/uL — ABNORMAL LOW (ref 3.87–5.11)
RDW: 13.2 % (ref 11.5–15.5)
WBC: 13.9 10*3/uL — ABNORMAL HIGH (ref 4.0–10.5)
nRBC: 0 % (ref 0.0–0.2)

## 2021-02-19 LAB — TYPE AND SCREEN
ABO/RH(D): A POS
Antibody Screen: NEGATIVE

## 2021-02-19 LAB — RESP PANEL BY RT-PCR (FLU A&B, COVID) ARPGX2
Influenza A by PCR: NEGATIVE
Influenza B by PCR: NEGATIVE
SARS Coronavirus 2 by RT PCR: NEGATIVE

## 2021-02-19 LAB — AMNISURE RUPTURE OF MEMBRANE (ROM) NOT AT ARMC: Amnisure ROM: POSITIVE

## 2021-02-19 MED ORDER — DOCUSATE SODIUM 100 MG PO CAPS
100.0000 mg | ORAL_CAPSULE | Freq: Every day | ORAL | Status: DC
  Filled 2021-02-19: qty 1

## 2021-02-19 MED ORDER — MAGNESIUM SULFATE 40 GM/1000ML IV SOLN
2.0000 g/h | INTRAVENOUS | Status: AC
  Filled 2021-02-19: qty 1000

## 2021-02-19 MED ORDER — CALCIUM CARBONATE ANTACID 500 MG PO CHEW: 2.0000 | CHEWABLE_TABLET | ORAL | Status: DC | PRN

## 2021-02-19 MED ORDER — AMOXICILLIN 500 MG PO CAPS: 500.0000 mg | ORAL_CAPSULE | Freq: Three times a day (TID) | ORAL | Status: DC

## 2021-02-19 MED ORDER — BETAMETHASONE SOD PHOS & ACET 6 (3-3) MG/ML IJ SUSP
12.0000 mg | INTRAMUSCULAR | Status: DC
  Administered 2021-02-19: 03:00:00 12 mg via INTRAMUSCULAR
  Filled 2021-02-19: qty 5

## 2021-02-19 MED ORDER — LACTATED RINGERS IV SOLN: INTRAVENOUS | Status: DC

## 2021-02-19 MED ORDER — MAGNESIUM SULFATE BOLUS VIA INFUSION
4.0000 g | Freq: Once | INTRAVENOUS | Status: AC
  Administered 2021-02-19: 04:00:00 4 g via INTRAVENOUS
  Filled 2021-02-19: qty 1000

## 2021-02-19 MED ORDER — ACETAMINOPHEN 325 MG PO TABS: 650.0000 mg | ORAL_TABLET | ORAL | Status: DC | PRN

## 2021-02-19 MED ORDER — AZITHROMYCIN 250 MG PO TABS
1000.0000 mg | ORAL_TABLET | Freq: Once | ORAL | Status: AC
  Administered 2021-02-19: 06:00:00 1000 mg via ORAL
  Filled 2021-02-19: qty 4

## 2021-02-19 MED ORDER — PRENATAL MULTIVITAMIN CH
1.0000 | ORAL_TABLET | Freq: Every day | ORAL | Status: DC
  Administered 2021-02-19: 14:00:00 1 via ORAL
  Filled 2021-02-19: qty 1

## 2021-02-19 MED ORDER — SODIUM CHLORIDE 0.9 % IV SOLN
2.0000 g | Freq: Four times a day (QID) | INTRAVENOUS | Status: DC
  Administered 2021-02-19: 11:00:00 2 g via INTRAVENOUS
  Filled 2021-02-19: qty 2000

## 2021-02-19 NOTE — H&P (Signed)
Chief Complaint:  Vaginal Bleeding and Vaginal Discharge   Event Date/Time   First Provider Initiated Contact with Patient 02/19/21 0122     HPI: Heather Hoffman is a 21 y.o. G2P1001 at 30w4dho presents to maternity admissions reporting leaking watery discharge tonight.  Has had bleeding off and on for "a while" but leaking fluid is new.  Last IC 2 days ago.  . She reports good fetal movement, denies urinary symptoms, h/a, dizziness, n/v, diarrhea, constipation or fever/chills.   Was diagnosed with Chlamydia and treatment ordered 01/30/21 but she states she did not take med until 2 days ago.   Vaginal Bleeding The patient's primary symptoms include vaginal bleeding and vaginal discharge. The patient's pertinent negatives include no genital itching, genital lesions, genital odor or pelvic pain. This is a new problem. The current episode started today. The problem has been unchanged. The patient is experiencing no pain. She is pregnant. Pertinent negatives include no abdominal pain, chills, dysuria, fever or headaches. The vaginal discharge was bloody and watery. She has not been passing clots. She has not been passing tissue. Nothing aggravates the symptoms. She has tried nothing for the symptoms. She is sexually active.  Vaginal Discharge The patient's primary symptoms include vaginal bleeding and vaginal discharge. The patient's pertinent negatives include no genital itching, genital lesions, genital odor or pelvic pain. The current episode started today. She is pregnant. Pertinent negatives include no abdominal pain, chills, dysuria, fever or headaches. The vaginal discharge was watery.   RN Note: I was here on 01/28/21 and was treated for Chlamydia. I had some bleeding since and then it stopped. About 331ms ago I was laying in bed and had a lot of fld come out. Also had a small amt of blood in my panties. No pain now. Had 2times of ctx -like pain earlier today at 1700 and 1900. Good FM  Past Medical  History: Past Medical History:  Diagnosis Date   Medical history non-contributory     Past obstetric history: OB History  Gravida Para Term Preterm AB Living  2 1 1     1   SAB IAB Ectopic Multiple Live Births               # Outcome Date GA Lbr Len/2nd Weight Sex Delivery Anes PTL Lv  2 Current           1 Term 2021     Vag-Spont       Past Surgical History: Past Surgical History:  Procedure Laterality Date   MOUTH SURGERY      Family History: Family History  Problem Relation Age of Onset   Hypertension Mother    Hypertension Maternal Grandfather     Social History: Social History   Tobacco Use   Smoking status: Never   Smokeless tobacco: Never  Vaping Use   Vaping Use: Former   Devices: stopped after finding out she was pregnant  Substance Use Topics   Alcohol use: Not Currently    Comment: stopped after finding out she was pregnant   Drug use: Yes    Types: Marijuana    Allergies: No Known Allergies  Meds:  Medications Prior to Admission  Medication Sig Dispense Refill Last Dose   Prenatal Vit-Fe Fumarate-FA (PREPLUS) 27-1 MG TABS Take 1 tablet by mouth daily. 30 tablet 13 Past Week   AMBULATORY NON FORMULARY MEDICATION 1 Device by Other route once a week. Blood Pressure Cuff/Medium  Monitored Regularly at home ICD 10: Z34.90 LROB (  Patient not taking: Reported on 08/03/2019) 1 kit 0    Pediatric Multiple Vit-C-FA (FLINSTONES GUMMIES OMEGA-3 DHA PO) Take by mouth.   More than a month    I have reviewed patient's Past Medical Hx, Surgical Hx, Family Hx, Social Hx, medications and allergies.   ROS:  Review of Systems  Constitutional:  Negative for chills and fever.  Gastrointestinal:  Negative for abdominal pain.  Genitourinary:  Positive for vaginal bleeding and vaginal discharge. Negative for dysuria and pelvic pain.  Neurological:  Negative for headaches.  Other systems negative  Physical Exam  Patient Vitals for the past 24 hrs:  BP Temp Pulse  Resp SpO2 Height Weight  02/19/21 0104 (!) 111/58 -- -- -- -- -- --  02/19/21 0102 -- 98.5 F (36.9 C) 81 16 100 % 5' 6"  (1.676 m) 62.1 kg   Constitutional: Well-developed, well-nourished female in no acute distress.  Cardiovascular: normal rate and rhythm Respiratory: normal effort, clear to auscultation bilaterally GI: Abd soft, non-tender, gravid appropriate for gestational age.   No rebound or guarding. MS: Extremities nontender, no edema, normal ROM Neurologic: Alert and oriented x 4.  GU: Neg CVAT.  PELVIC EXAM: Cervix pink, visually closed, scant watery dark red/brown discharge, vaginal walls and external genitalia normal   GC/Chlam not collected due to only taking med 2 days ago.   FHT:  Baseline 150 , moderate variability, small accelerations present, no decelerations Contractions: Uterine irritability with occasional contractions   Labs: Results for orders placed or performed during the hospital encounter of 02/19/21 (from the past 24 hour(s))  Urinalysis, Routine w reflex microscopic Urine, Clean Catch     Status: Abnormal   Collection Time: 02/19/21  1:10 AM  Result Value Ref Range   Color, Urine YELLOW YELLOW   APPearance CLOUDY (A) CLEAR   Specific Gravity, Urine 1.023 1.005 - 1.030   pH 7.0 5.0 - 8.0   Glucose, UA NEGATIVE NEGATIVE mg/dL   Hgb urine dipstick LARGE (A) NEGATIVE   Bilirubin Urine NEGATIVE NEGATIVE   Ketones, ur NEGATIVE NEGATIVE mg/dL   Protein, ur NEGATIVE NEGATIVE mg/dL   Nitrite NEGATIVE NEGATIVE   Leukocytes,Ua NEGATIVE NEGATIVE   RBC / HPF 6-10 0 - 5 RBC/hpf   WBC, UA 0-5 0 - 5 WBC/hpf   Bacteria, UA RARE (A) NONE SEEN   Squamous Epithelial / LPF 0-5 0 - 5   Mucus PRESENT    Amorphous Crystal PRESENT   Amnisure rupture of membrane (rom)not at [facility]     Status: None   Collection Time: 02/19/21  1:35 AM  Result Value Ref Range   Amnisure ROM POSITIVE     Imaging:  Growth and AFI [place] ordered  MAU Course/MDM: I have ordered labs and  reviewed results.   Amnisure is positive NST reviewed, reassuring for gestational age Consult Dr Elly Modena with presentation, exam findings and test results.  Treatments in MAU included EFM, Amnisure.    Assessment: SIngle IUP at 14w4dPreterm Premature Rupture of Membranes Recently treated Chlamydia  [facility]: [facility] Antibiotics [place]for Growth and AFI MD to follow  MHansel FeinsteinCNM, MSN Certified Nurse-Midwife 02/19/2021 1:22 AM

## 2021-02-19 NOTE — MAU Note (Signed)
PT SAYS AT 0030- WHILE LAYING IN BED - SAW FLUID LEAKING OUT - LAST TIME SEX- WAS Tuesday AM DENIES FLUID LEAKING OUT NOW SAW STREAM OF BLOOD IN URINE  AT 0030 HERE WHEN COLLECTED UA SAMPLE- SHE SAW BROWNISH RED ON PAD AND WHEN SHE WIPED  SHE FEELS BABY MOVING  DENIES ANY PAIN

## 2021-02-19 NOTE — MAU Note (Signed)
I was here on 01/28/21 and was treated for Chlamydia. I had some bleeding since and then it stopped. About ago I was laying in bed and had a lot of fld come out. Also had a small amt of blood in my panties. No pain now. Had 2times of ctx -like pain earlier today at 1700 and 1900. Good FM

## 2021-02-19 NOTE — Discharge Summary (Signed)
Patient ID: Heather Hoffman MRN: [id number] DOB/AGE: Apr 27, 2000 20 y.o.  Admit date: 02/19/2021 Discharge date: 02/19/2021  Admission Diagnoses:PROM, IUP 21 4/7 weeks  Discharge Diagnoses: SAA, undelivered  Prenatal Procedures: ultrasound  Consults: Neonatology, Maternal Fetal Medicine  Hospital Course:  This is a 21 y.o. G2P1001 with IUP at [redacted]w[redacted]d admitted for PROM. There was some concern in regards to her dating. MFM was consulted and final EDD of 06/28/21 was selected by U/S. Prior to this decision she was treated as if 23 weeks by her LMP. She received magnesium for neuroprotection, BMZ for FLM and antibiotic therpay. But once final EDD was decided upon, these treatments were discontinued and the pt was discharged home with f/u in the office and hopeful readmission at 24 weeks for further in house management. POC, discharge instructions and medications were reviewed with pt. Pt verbalized understanding and was discharged home.   Discharge Exam: Temp:  [97.6 F (36.4 C)-99 F (37.2 C)] 97.8 F (36.6 C) (01/25 1208) Pulse Rate:  [67-83] 74 (01/25 1208) Resp:  [16-20] 20 (01/25 1403) BP: (101-126)/(44-69) 101/45 (01/25 1208) SpO2:  [98 %-100 %] 100 % (01/25 1208) Weight:  [62.1 kg] 62.1 kg (01/25 0102) Physical Examination: Lungs clear Heart RRR Abd soft + BS gravid non tender GU deferred Ext non tender   Significant Diagnostic Studies:  Results for orders placed or performed during the hospital encounter of 02/19/21 (from the past 168 hour(s))  Urinalysis, Routine w reflex microscopic Urine, Clean Catch   Collection Time: 02/19/21  1:10 AM  Result Value Ref Range   Color, Urine YELLOW YELLOW   APPearance CLOUDY (A) CLEAR   Specific Gravity, Urine 1.023 1.005 - 1.030   pH 7.0 5.0 - 8.0   Glucose, UA NEGATIVE NEGATIVE mg/dL   Hgb urine dipstick LARGE (A) NEGATIVE   Bilirubin Urine NEGATIVE NEGATIVE   Ketones, ur NEGATIVE NEGATIVE mg/dL   Protein, ur NEGATIVE NEGATIVE mg/dL    Nitrite NEGATIVE NEGATIVE   Leukocytes,Ua NEGATIVE NEGATIVE   RBC / HPF 6-10 0 - 5 RBC/hpf   WBC, UA 0-5 0 - 5 WBC/hpf   Bacteria, UA RARE (A) NONE SEEN   Squamous Epithelial / LPF 0-5 0 - 5   Mucus PRESENT    Amorphous Crystal PRESENT   Amnisure rupture of membrane (rom)not at [facility]   Collection Time: 02/19/21  1:35 AM  Result Value Ref Range   Amnisure ROM POSITIVE   Resp Panel by RT-PCR (Flu A&B, Covid) Nasopharyngeal Swab   Collection Time: 02/19/21  2:45 AM   Specimen: Nasopharyngeal Swab; Nasopharyngeal(NP) swabs in vial transport medium  Result Value Ref Range   SARS Coronavirus 2 by RT PCR NEGATIVE NEGATIVE   Influenza A by PCR NEGATIVE NEGATIVE   Influenza B by PCR NEGATIVE NEGATIVE  CBC on admission   Collection Time: 02/19/21  2:45 AM  Result Value Ref Range   WBC 13.9 (H) 4.0 - 10.5 K/uL   RBC 3.43 (L) 3.87 - 5.11 MIL/uL   Hemoglobin 10.9 (L) 12.0 - 15.0 g/dL   HCT 67.6 (L) 72.0 - 94.7 %   MCV 97.4 80.0 - 100.0 fL   MCH 31.8 26.0 - 34.0 pg   MCHC 32.6 30.0 - 36.0 g/dL   RDW 09.6 28.3 - 66.2 %   Platelets 207 150 - 400 K/uL   nRBC 0.0 0.0 - 0.2 %  Type and screen [facility]   Collection Time: 02/19/21  2:45 AM  Result Value Ref Range  ABO/RH(D) A POS    Antibody Screen NEG    Sample Expiration      02/22/2021,2359 Performed at [facility], [address], [place], [place] [address]     Discharge Condition: Stable  Disposition: Discharge disposition: 01-Home or Self Care        Discharge Instructions     Discharge activity:  No Restrictions   Complete by: As directed    Discharge diet:  No restrictions   Complete by: As directed    Do not have sex or do anything that might make you have an orgasm   Complete by: As directed    Notify physician for a general feeling that "something is not right"   Complete by: As directed    Notify physician for increase or change in vaginal discharge   Complete by: As directed     Notify physician for intestinal cramps, with or without diarrhea, sometimes described as "gas pain"   Complete by: As directed    Notify physician for leaking of fluid   Complete by: As directed    Notify physician for low, dull backache, unrelieved by heat or Tylenol   Complete by: As directed    Notify physician for menstrual like cramps   Complete by: As directed    Notify physician for pelvic pressure   Complete by: As directed    Notify physician for uterine contractions.  These may be painless and feel like the uterus is tightening or the baby is  "balling up"   Complete by: As directed    Notify physician for vaginal bleeding   Complete by: As directed    PRETERM LABOR:  Includes any of the follwing symptoms that occur between 20 - [redacted] weeks gestation.  If these symptoms are not stopped, preterm labor can result in preterm delivery, placing your baby at risk   Complete by: As directed       Allergies as of 02/19/2021   No Known Allergies      Medication List     STOP taking these medications    AMBULATORY NON FORMULARY MEDICATION   azithromycin 500 MG tablet Commonly known as: ZITHROMAX   FLINSTONES GUMMIES OMEGA-3 DHA PO       TAKE these medications    PrePLUS 27-1 MG Tabs Take 1 tablet by mouth daily.        Follow-up Information     [facility] Follow up in 1 week(s).   Specialty: Obstetrics and Gynecology Contact information: [address] [place] [address] [phone]                Signed: Hermina Staggers M.D. 02/19/2021, 3:13 PM

## 2021-02-19 NOTE — Progress Notes (Signed)
Patient ID: Heather Hoffman, female   DOB: 2000/10/10, 21 y.o.   MRN: [id number] [facility]ULT[facility] PRACTICE ANTEPARTUM COMPREHENSIVE PROGRESS NOTE  Heather Hoffman is a 21 y.o. G2P1001 at [redacted]w[redacted]d  who is admitted for PROM.   Fetal presentation is unsure. Length of Stay:  0  Days  Subjective: Patient reports good fetal movement.  She reports no uterine contractions. Having dark blooding discharge with some loss of fluid as well.    Vitals:  Blood pressure (!) 101/45, pulse 82, temperature 97.8 F (36.6 C), temperature source Oral, resp. rate 18, height 5\' 6"  (1.676 m), weight 62.1 kg, last menstrual period 09/07/2020, SpO2 100 %, unknown if currently breastfeeding.  Physical Examination: Lungs clear Heart RRR Abd soft, + BS gravid, non tender GU deferred Ext non tender    Labs:  Results for orders placed or performed during the hospital encounter of 02/19/21 (from the past 24 hour(s))  Urinalysis, Routine w reflex microscopic Urine, Clean Catch   Collection Time: 02/19/21  1:10 AM  Result Value Ref Range   Color, Urine YELLOW YELLOW   APPearance CLOUDY (A) CLEAR   Specific Gravity, Urine 1.023 1.005 - 1.030   pH 7.0 5.0 - 8.0   Glucose, UA NEGATIVE NEGATIVE mg/dL   Hgb urine dipstick LARGE (A) NEGATIVE   Bilirubin Urine NEGATIVE NEGATIVE   Ketones, ur NEGATIVE NEGATIVE mg/dL   Protein, ur NEGATIVE NEGATIVE mg/dL   Nitrite NEGATIVE NEGATIVE   Leukocytes,Ua NEGATIVE NEGATIVE   RBC / HPF 6-10 0 - 5 RBC/hpf   WBC, UA 0-5 0 - 5 WBC/hpf   Bacteria, UA RARE (A) NONE SEEN   Squamous Epithelial / LPF 0-5 0 - 5   Mucus PRESENT    Amorphous Crystal PRESENT   Amnisure rupture of membrane (rom)not at [facility]   Collection Time: 02/19/21  1:35 AM  Result Value Ref Range   Amnisure ROM POSITIVE   Resp Panel by RT-PCR (Flu A&B, Covid) Nasopharyngeal Swab   Collection Time: 02/19/21  2:45 AM   Specimen: Nasopharyngeal Swab; Nasopharyngeal(NP) swabs in vial transport medium  Result Value Ref Range   SARS  Coronavirus 2 by RT PCR NEGATIVE NEGATIVE   Influenza A by PCR NEGATIVE NEGATIVE   Influenza B by PCR NEGATIVE NEGATIVE  CBC on admission   Collection Time: 02/19/21  2:45 AM  Result Value Ref Range   WBC 13.9 (H) 4.0 - 10.5 K/uL   RBC 3.43 (L) 3.87 - 5.11 MIL/uL   Hemoglobin 10.9 (L) 12.0 - 15.0 g/dL   HCT 02/21/21 (L) 21.2 - 24.8 %   MCV 97.4 80.0 - 100.0 fL   MCH 31.8 26.0 - 34.0 pg   MCHC 32.6 30.0 - 36.0 g/dL   RDW 25.0 03.7 - 04.8 %   Platelets 207 150 - 400 K/uL   nRBC 0.0 0.0 - 0.2 %  Type and screen [facility]   Collection Time: 02/19/21  2:45 AM  Result Value Ref Range   ABO/RH(D) A POS    Antibody Screen NEG    Sample Expiration      02/22/2021,2359 Performed at [facility], [address], [place], [place]     Imaging Studies:    OB U/S   Medications:  Scheduled  [START ON 02/21/2021] amoxicillin  500 mg Oral TID   betamethasone acetate-betamethasone sodium phosphate  12 mg Intramuscular Q24H   docusate sodium  100 mg Oral Daily   prenatal multivitamin  1 tablet Oral  Q1200   I have reviewed the patient's current medications.  ASSESSMENT: IUP 23 4/7 ? PROM Recent chlamydia, S/P tx No prenatal care  PLAN: Dates by LMP are off by @ 2 weeks, IUGR or dating.  Will consult MFM for their input. Will continue with Tx as if 23 + weeks for now. NICU consult pending.  Continue routine antenatal care.   Hermina Staggers 02/19/2021,10:46 AM

## 2021-02-19 NOTE — MAU Provider Note (Signed)
Chief Complaint:  Vaginal Bleeding and Vaginal Discharge   Event Date/Time   First Provider Initiated Contact with Patient 02/19/21 0122     HPI: Heather Hoffman is a 21 y.o. G2P1001 at 49w4dho presents to maternity admissions reporting leaking watery discharge tonight.  Has had bleeding off and on for "a while" but leaking fluid is new.  Last IC 2 days ago.  . She reports good fetal movement, denies urinary symptoms, h/a, dizziness, n/v, diarrhea, constipation or fever/chills.   Was diagnosed with Chlamydia and treatment ordered 01/30/21 but she states she did not take med until 2 days ago.   Vaginal Bleeding The patient's primary symptoms include vaginal bleeding and vaginal discharge. The patient's pertinent negatives include no genital itching, genital lesions, genital odor or pelvic pain. This is a new problem. The current episode started today. The problem has been unchanged. The patient is experiencing no pain. She is pregnant. Pertinent negatives include no abdominal pain, chills, dysuria, fever or headaches. The vaginal discharge was bloody and watery. She has not been passing clots. She has not been passing tissue. Nothing aggravates the symptoms. She has tried nothing for the symptoms. She is sexually active.  Vaginal Discharge The patient's primary symptoms include vaginal bleeding and vaginal discharge. The patient's pertinent negatives include no genital itching, genital lesions, genital odor or pelvic pain. The current episode started today. She is pregnant. Pertinent negatives include no abdominal pain, chills, dysuria, fever or headaches. The vaginal discharge was watery.   RN Note: I was here on 01/28/21 and was treated for Chlamydia. I had some bleeding since and then it stopped. About 390ms ago I was laying in bed and had a lot of fld come out. Also had a small amt of blood in my panties. No pain now. Had 2times of ctx -like pain earlier today at 1700 and 1900. Good FM  Past Medical  History: Past Medical History:  Diagnosis Date   Medical history non-contributory     Past obstetric history: OB History  Gravida Para Term Preterm AB Living  2 1 1     1   SAB IAB Ectopic Multiple Live Births               # Outcome Date GA Lbr Len/2nd Weight Sex Delivery Anes PTL Lv  2 Current           1 Term 2021     Vag-Spont       Past Surgical History: Past Surgical History:  Procedure Laterality Date   MOUTH SURGERY      Family History: Family History  Problem Relation Age of Onset   Hypertension Mother    Hypertension Maternal Grandfather     Social History: Social History   Tobacco Use   Smoking status: Never   Smokeless tobacco: Never  Vaping Use   Vaping Use: Former   Devices: stopped after finding out she was pregnant  Substance Use Topics   Alcohol use: Not Currently    Comment: stopped after finding out she was pregnant   Drug use: Yes    Types: Marijuana    Allergies: No Known Allergies  Meds:  Medications Prior to Admission  Medication Sig Dispense Refill Last Dose   Prenatal Vit-Fe Fumarate-FA (PREPLUS) 27-1 MG TABS Take 1 tablet by mouth daily. 30 tablet 13 Past Week   AMBULATORY NON FORMULARY MEDICATION 1 Device by Other route once a week. Blood Pressure Cuff/Medium  Monitored Regularly at home ICD 10: Z34.90 LROB (  Patient not taking: Reported on 08/03/2019) 1 kit 0    Pediatric Multiple Vit-C-FA (FLINSTONES GUMMIES OMEGA-3 DHA PO) Take by mouth.   More than a month    I have reviewed patient's Past Medical Hx, Surgical Hx, Family Hx, Social Hx, medications and allergies.   ROS:  Review of Systems  Constitutional:  Negative for chills and fever.  Gastrointestinal:  Negative for abdominal pain.  Genitourinary:  Positive for vaginal bleeding and vaginal discharge. Negative for dysuria and pelvic pain.  Neurological:  Negative for headaches.  Other systems negative  Physical Exam  Patient Vitals for the past 24 hrs:  BP Temp Pulse  Resp SpO2 Height Weight  02/19/21 0104 (!) 111/58 -- -- -- -- -- --  02/19/21 0102 -- 98.5 F (36.9 C) 81 16 100 % 5' 6"  (1.676 m) 62.1 kg   Constitutional: Well-developed, well-nourished female in no acute distress.  Cardiovascular: normal rate and rhythm Respiratory: normal effort, clear to auscultation bilaterally GI: Abd soft, non-tender, gravid appropriate for gestational age.   No rebound or guarding. MS: Extremities nontender, no edema, normal ROM Neurologic: Alert and oriented x 4.  GU: Neg CVAT.  PELVIC EXAM: Cervix pink, visually closed, scant watery dark red/brown discharge, vaginal walls and external genitalia normal   GC/Chlam not collected due to only taking med 2 days ago.   FHT:  Baseline 150 , moderate variability, small accelerations present, no decelerations Contractions: Uterine irritability with occasional contractions   Labs: Results for orders placed or performed during the hospital encounter of 02/19/21 (from the past 24 hour(s))  Urinalysis, Routine w reflex microscopic Urine, Clean Catch     Status: Abnormal   Collection Time: 02/19/21  1:10 AM  Result Value Ref Range   Color, Urine YELLOW YELLOW   APPearance CLOUDY (A) CLEAR   Specific Gravity, Urine 1.023 1.005 - 1.030   pH 7.0 5.0 - 8.0   Glucose, UA NEGATIVE NEGATIVE mg/dL   Hgb urine dipstick LARGE (A) NEGATIVE   Bilirubin Urine NEGATIVE NEGATIVE   Ketones, ur NEGATIVE NEGATIVE mg/dL   Protein, ur NEGATIVE NEGATIVE mg/dL   Nitrite NEGATIVE NEGATIVE   Leukocytes,Ua NEGATIVE NEGATIVE   RBC / HPF 6-10 0 - 5 RBC/hpf   WBC, UA 0-5 0 - 5 WBC/hpf   Bacteria, UA RARE (A) NONE SEEN   Squamous Epithelial / LPF 0-5 0 - 5   Mucus PRESENT    Amorphous Crystal PRESENT   Amnisure rupture of membrane (rom)not at [facility]     Status: None   Collection Time: 02/19/21  1:35 AM  Result Value Ref Range   Amnisure ROM POSITIVE     Imaging:  Growth and AFI [place] ordered  MAU Course/MDM: I have ordered labs and  reviewed results.   Amnisure is positive NST reviewed, reassuring for gestational age Consult Dr Elly Modena with presentation, exam findings and test results.  Treatments in MAU included EFM, Amnisure.    Assessment: SIngle IUP at 70w4dPreterm Premature Rupture of Membranes Recently treated Chlamydia  [facility]: [facility] Antibiotics [place]for Growth and AFI MD to follow  MHansel FeinsteinCNM, MSN Certified Nurse-Midwife 02/19/2021 1:22 AM

## 2021-02-19 NOTE — H&P (Signed)
Maternal-Fetal Medicine   Name: Heather Hoffman DOB: 01-28-2000 MRN: [id number] Referring Provider: Arlina Robes, MD  I had the pleasure of seeing Heather Hoffman today in the [facility] specialty care. She is G2 P1 at unknown gestational age (and presumably 23w 4 d gestation) was admitted today morning with complaints of leakage of amniotic fluid.  Patient has been having intermittent vaginal bleeding over the last 3 weeks.  She was evaluated the MAU on 01/28/2021 for complaints of vaginal bleeding.  At that visit, her gestational age was calculated based on her LMP date.  Patient reports that was inaccurate and her LMP was 09/22/2020.  On speculum examination at the MAU, scanty vaginal discharge was noted.  Patient does not give history of vaginal bleeding or abdominal pain or fever.  Since admission, patient is receiving magnesium sulfate infusion and had received 1 dose of betamethasone.  She is also receiving IV antibiotics. She had chlamydia infection that was treated at her last visit. Patient had no prenatal care and no dating ultrasound was performed.  Obstetric history significant for a term vaginal delivery at [facility] in July 2021 of a female infant weighing 6 pounds and 4 ounces at birth.  Her pregnancy and delivery were uncomplicated. GYN history: No history of abnormal Pap smears or cervical surgeries. Past medical history: No history of diabetes or hypertension or any chronic medical conditions. Past surgical history: Nil of note. Allergies: No known drug allergies. Social history: Denies tobacco or drug or alcohol use.  Her partner is African-American and he is in good health. Family history: No history of venous thromboembolism in the family  Ultrasound Fetal biometry is consistent with 21 weeks and 4 days gestation.  Oligohydramnios is seen.  Maximum vertical pocket was 2 cm.  Cephalic presentation.  Placenta is posterior and I cannot rule out the possibility of  placenta previa.  We did not perform transvaginal ultrasound. Fetal anatomical survey appears normal but limited by oligohydramnios.  PPROM before viability Gestational age: Patient is very sure of her LMP date of 09/22/2020 which she retrieved from her text messages to her boyfriend.  Our ultrasound dating is consistent with her LMP date.  Based on this EDD, her gestational age is 24 weeks and 4 days. .  I recommend that her gestational age be assigned at 06/28/2021, which is based on today's ultrasound. I counseled the patient on PPROM.  -About 50% deliver between 1 to 2 weeks after PPROM and about 85% deliver within a month.   -Lung development takes place between 70- and 24-weeks' gestation. Pulmonary hypoplasia can occur if PPROM occurs early in gestation. Pulmonary hypoplasia cannot be predicted on ultrasound. If the newborn has severe pulmonary hypoplasia, there is increased postnatal mortality. One would expect a 20% chance of having pulmonary hypoplasia after ROM at 20 weeks.   -Other complications include maternal/fetal infections, placental abruption, cord prolapse, fetal demise and limb contractures (reversible).   -We recommend inpatient management from 23 weeks.    -Outpatient management before viability is recommended. I advised the patient to abstain from sexual intercourse, vaginal examination and insertion of tampons.   -It is unlikely antibiotics will help at this gestational age and no clear recommendation exists.   -If expectant management is successful, we recommend delivery at 34 weeks as risk from intraamniotic infection outweighs the neonatal benefits at that gestational age.   -Neonatal outcomes are gestational-age dependent. If delivery occurs before 28 weeks' gestation, prematurity complications are higher. Poor neurodevelopmental outcomes including  cerebral palsy are higher.   -Delivery would be recommended regardless of gestational age if signs of maternal/fetal  infections are present.   -Termination of pregnancy is an option if mother perceives increased maternal or fetal complications. Patient informed that she will continue her pregnancy regardless of risks.  Recommendations -Discharge home. -Discontinue antibiotics and betamethasone. -Readmit at 23 weeks' gestation. Patient may register and initiate prenatal care. Patient desires to deliver at [facility] and may decide to go there. -After readmission, steroids and antibiotics may be withheld till neonatology consultation. -Screening for fetal aneuploidies to be considered when she initiates her prenatal care in the interval.  Thank you for consultation.  If you have any questions or concerns, please contact me the [facility].

## 2021-02-19 NOTE — Progress Notes (Signed)
Discharge instructions given to pt. Discussed signs and symptoms to report to the MD, upcoming appointments, and meds. Pt verbalizes understanding and has no questions at this time. Pt discharged home from hospital in stable condition. 

## 2021-02-21 LAB — CULTURE, BETA STREP (GROUP B ONLY)

## 2021-03-03 ENCOUNTER — Encounter: Payer: Self-pay | Admitting: *Deleted

## 2021-03-05 ENCOUNTER — Telehealth: Payer: Self-pay | Admitting: *Deleted

## 2021-03-05 ENCOUNTER — Encounter: Payer: Self-pay | Admitting: Family Medicine

## 2021-03-05 NOTE — Telephone Encounter (Signed)
Reviewed pt c/o and call with Dr. Alvester Morin . Will stick with plan of care as scheduled- has ob appt 03/10/21 unless patient not feeling baby move well. If she is not she should go to the hospital. I called Eveleigh and left message on her voicemail I was calling back and wanted to let her know if she is feeling her baby move, keep appointment Monday 03/10/21. If she is not feeling baby move she needs to go to the hospital today. Will send MyChart message also. Marilynn Ekstein,RN

## 2021-03-05 NOTE — Telephone Encounter (Signed)
Patient called afterhours nurse on 03/03/21 for c/o bad pain in rectum, pressure, abd pain, hx PROM . Per chart review has not been seen at the hospital since her call as advised or in the hospital. I called Heather Hoffman and she states the pain and pressure stopped the same day and she is fine now. She is aware of appointment next week. I advised her to call us as needed and if she has severe pain, fever, unusual discharge to go to hospital. She voices understanding. Manuel Dall,RN

## 2021-03-05 NOTE — Telephone Encounter (Signed)
I called Charlotta again and explained I wanted to ask if she is feeling her baby move well. She states she is feeling her baby move well everyday. I advised her if she does not feel her baby move well she needs to go the hospital. I reviewed her appointment with her for Monday 03/10/21. She voices understanding. Dereon Williamsen,RN

## 2021-03-10 ENCOUNTER — Ambulatory Visit (INDEPENDENT_AMBULATORY_CARE_PROVIDER_SITE_OTHER): Payer: Medicaid Other | Admitting: Obstetrics and Gynecology

## 2021-03-10 ENCOUNTER — Other Ambulatory Visit: Payer: Self-pay

## 2021-03-10 VITALS — BP 121/60 | HR 101 | Temp 98.2°F

## 2021-03-10 DIAGNOSIS — O4402 Placenta previa specified as without hemorrhage, second trimester: Secondary | ICD-10-CM | POA: Diagnosis not present

## 2021-03-10 DIAGNOSIS — Z3480 Encounter for supervision of other normal pregnancy, unspecified trimester: Secondary | ICD-10-CM | POA: Insufficient documentation

## 2021-03-10 DIAGNOSIS — Z3A24 24 weeks gestation of pregnancy: Secondary | ICD-10-CM | POA: Diagnosis not present

## 2021-03-10 NOTE — Progress Notes (Signed)
PRENATAL VISIT NOTE  Subjective:  Heather Hoffman is a 21 y.o. G2P1001 at [redacted]w[redacted]d being seen today for ongoing prenatal care.  She is currently monitored for the following issues for this high-risk pregnancy and has Chlamydia; UTI (urinary tract infection); Supervision of normal first pregnancy, antepartum; Chlamydia infection during pregnancy, antepartum; Adjustment insomnia; Intractable migraine with aura without status migrainosus; Severe episode of recurrent major depressive disorder, without psychotic features ([place]); Underweight; Abnormal genetic test; Positive RPR test; Anemia in pregnancy; Preterm premature rupture of membranes (PPROM) with unknown onset of labor; Supervision of other normal pregnancy, antepartum; and Placenta previa in second trimester on their problem list.  Patient reports  continued LOF (clear) .   . Vag. Bleeding: Scant.  Movement: Present. Denies leaking of fluid.   The following portions of the patient's history were reviewed and updated as appropriate: allergies, current medications, past family history, past medical history, past social history, past surgical history and problem list.   Objective:   Vitals:   03/10/21 1535  BP: 121/60  Pulse: (!) 101  Temp: 98.2 F (36.8 C)    Fetal Status: Fetal Heart Rate (bpm): 156   Movement: Present     General:  Alert, oriented and cooperative. Patient is in no acute distress.  Skin: Skin is warm and dry. No rash noted.   Cardiovascular: Normal heart rate noted  Respiratory: Normal respiratory effort, no problems with respiration noted  Abdomen: Soft, gravid, appropriate for gestational age.  Pain/Pressure: Present     Pelvic: Cervical exam deferred        Extremities: Normal range of motion.  Edema: None  Mental Status: Normal mood and affect. Normal behavior. Normal judgment and thought content.   Assessment and Plan:  Pregnancy: G2P1001 at [redacted]w[redacted]d 1. PPROM Patient presented to [facility] on 02/19/21 for VB and  LOR and was diagnosed with PPROM. No prenatal care and poor dating and she was initally thought to be 23 weeks so she was given bmz x 1, Mg and abx but then an MFM anatomy u/s was done and it put her EDC as 6/3 which made her 21/4 weeks. She was discharged to home and plan was for her to be direct admitted to the hospital at 23wks.  I don't see any visits scheduled prior to today and  the patient is not aware of any but she did call the clinic about a week ago for rectal pressure and pain and was told to keep her appt for 2/13.  I told her that she does not need her new OB visit for today and she needs to go to the hospital for admission and she would stay there until delivery, 34wks at the latest. She states that was also her understanding.   I told her that I will call over to [facility] to direct admit her, but she states she wants to go to [facility] and be admitted there since that is what she did with her last baby and delivery ([place] with [facility] and she presented at [facility] for delivery). I told her I don't have admitting privileges there and if she desires to do that then that is up to her but I recommend she go today and if she changes her mind and desires to be admitted to [facility], to just show up at [facility]. Pt voices understanding  2. Placenta previa in second trimester  Preterm labor symptoms and general obstetric precautions including but not limited to vaginal bleeding, contractions, leaking of  fluid and fetal movement were reviewed in detail with the patient. Please refer to After Visit Summary for other counseling recommendations.   Return if symptoms worsen or fail to improve.  No future appointments.  Aletha Halim, MD

## 2021-04-04 IMAGING — US US OB COMP LESS 14 WK
1 series · 15 of 17 positions shown · non-contrast
Comparison: None.

CLINICAL DATA: Cramping and nausea

EXAM:
OBSTETRIC <14 WK ULTRASOUND
TECHNIQUE: Transabdominal ultrasound was performed for evaluation of the
gestation as well as the maternal uterus and adnexal regions.

[Series 1: us ob comp less 14 wk · 17 acquisitions, 15 frames shown]
[im 1/17]
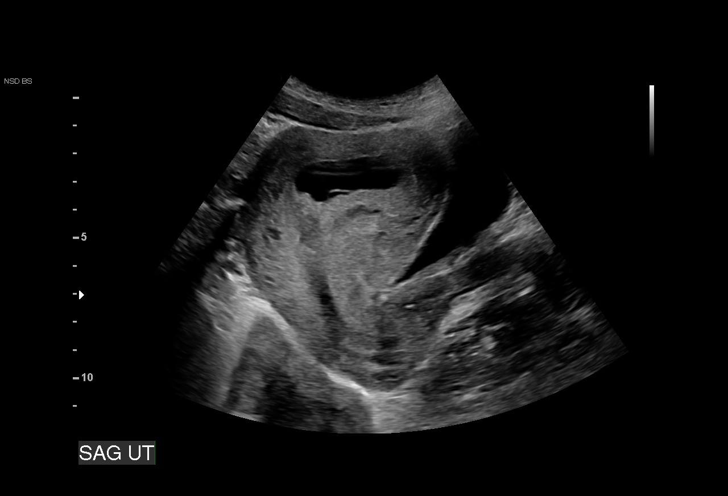
[im 2/17]
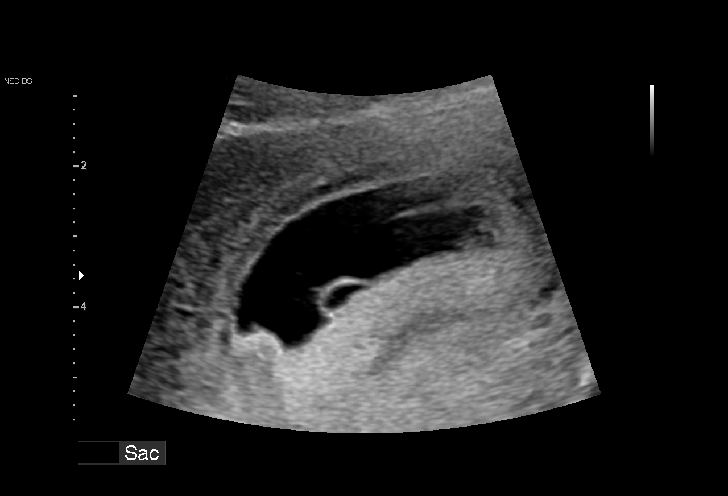
[im 3/17]
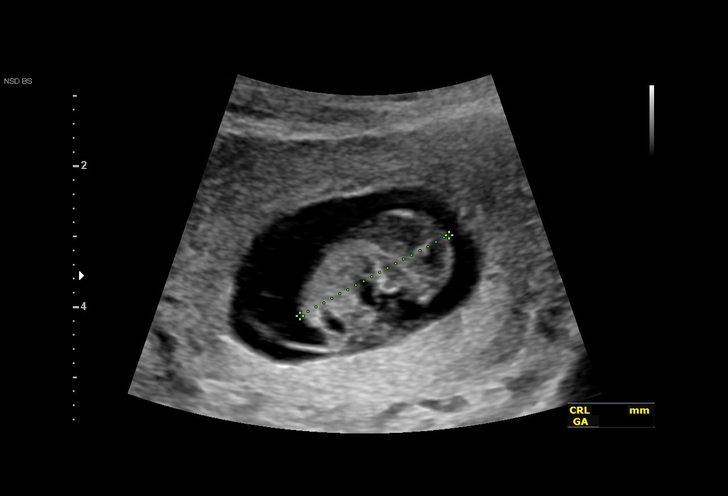
[im 4/17]
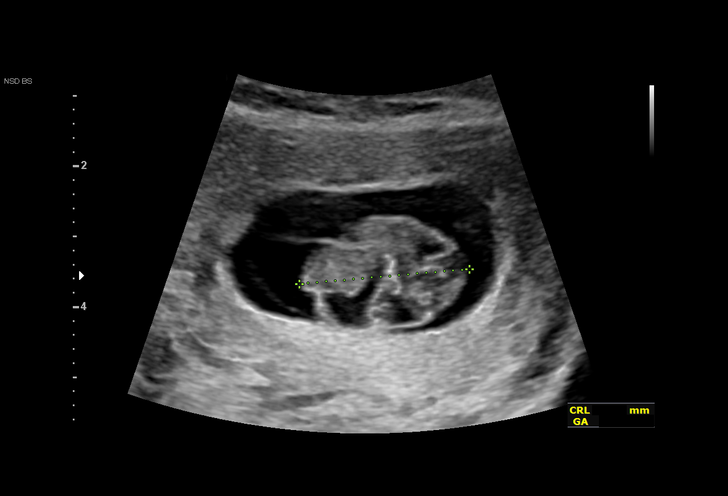
[im 6/17]
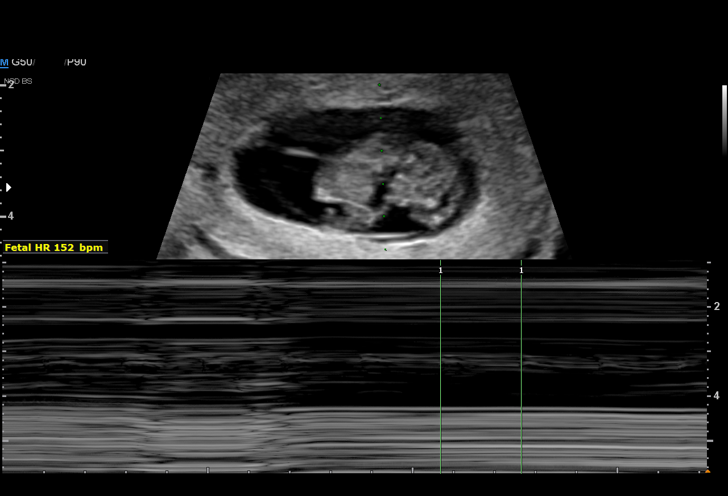
[im 7/17]
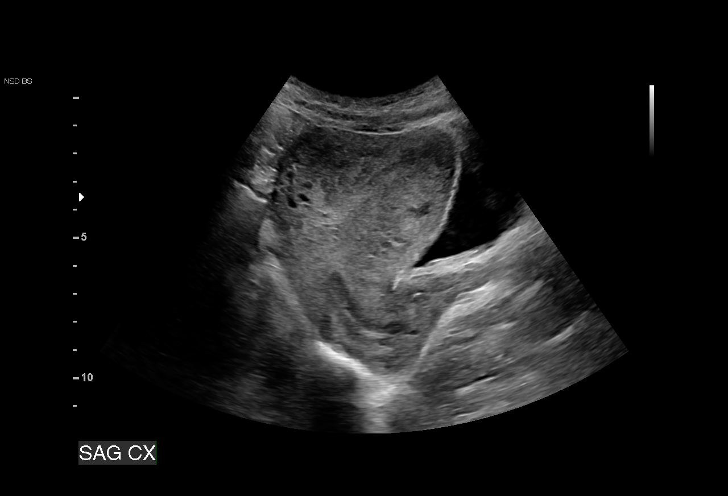
[im 8/17]
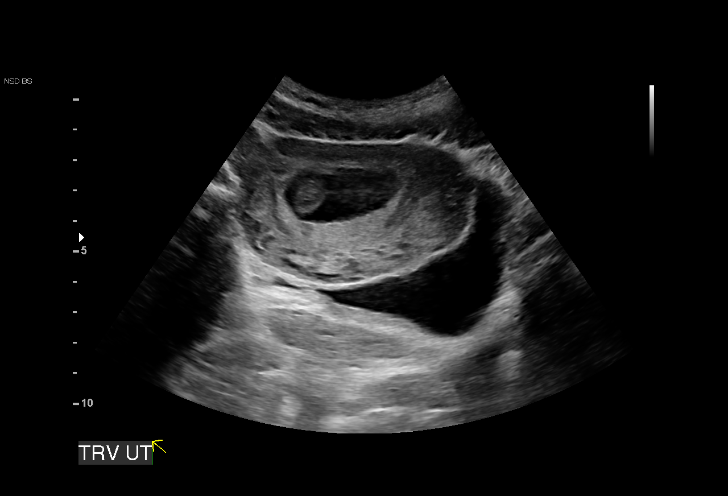
[im 9/17]
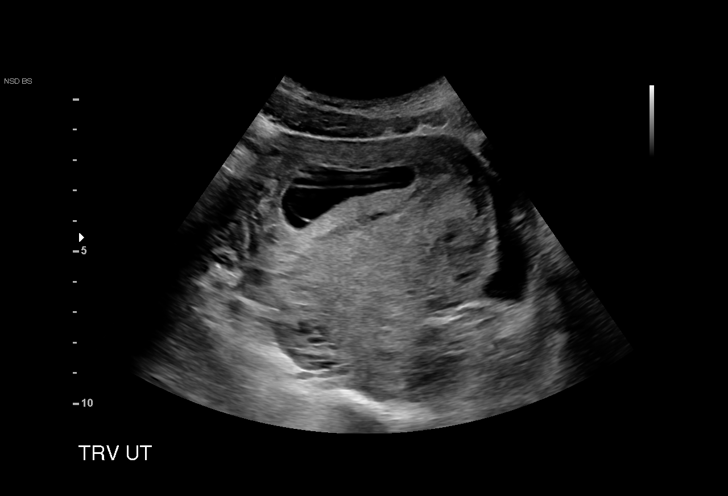
[im 10/17]
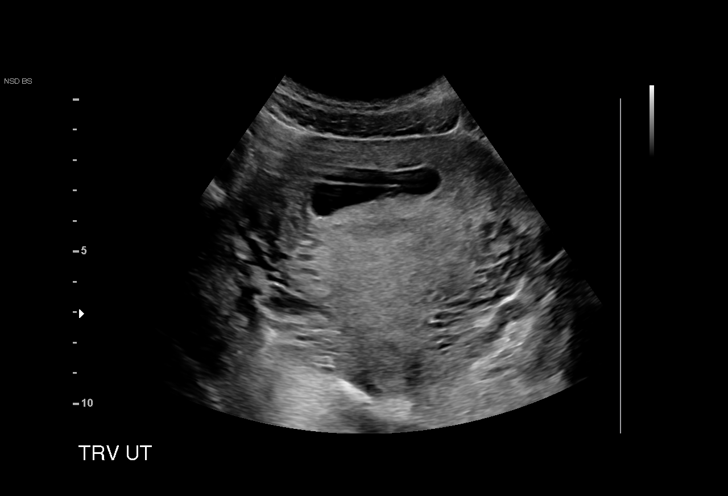
[im 11/17]
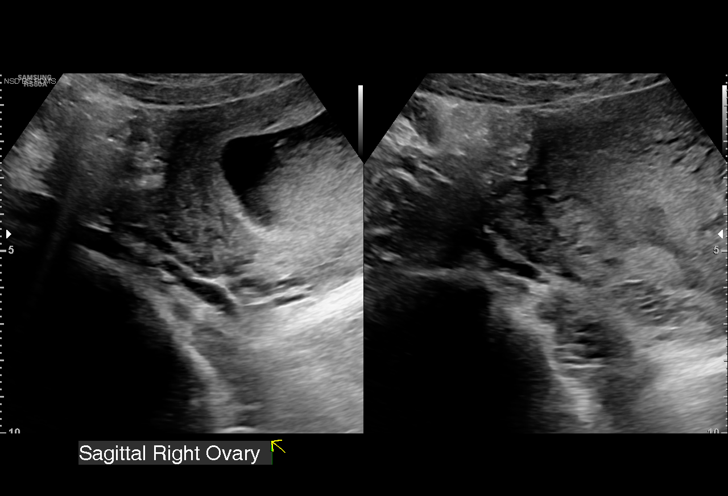
[im 12/17]
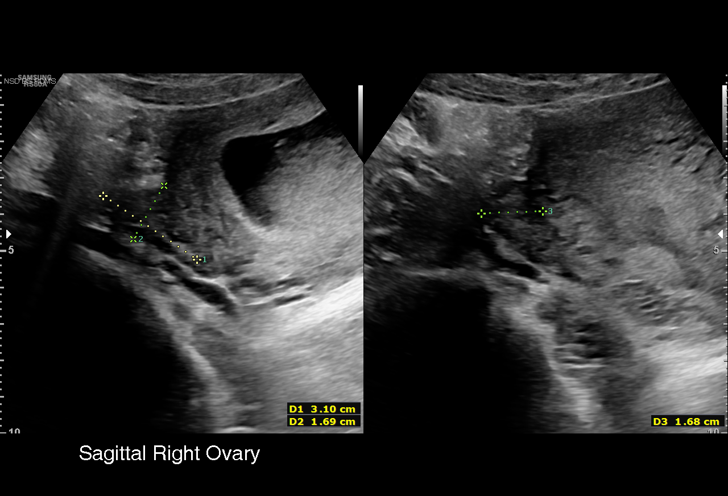
[im 14/17]
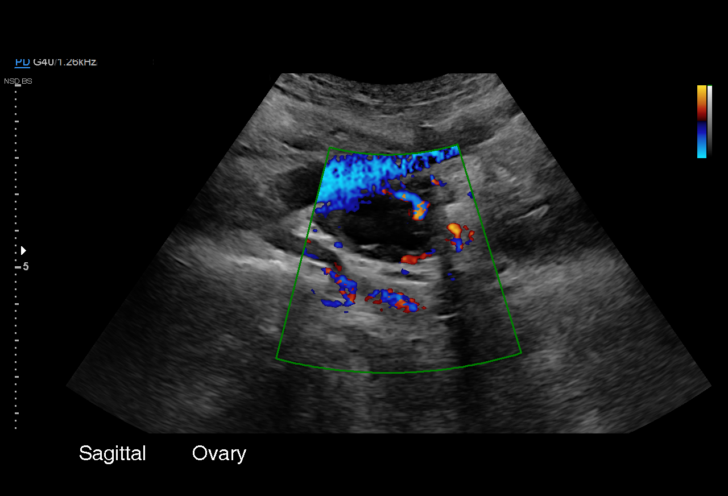
[im 15/17]
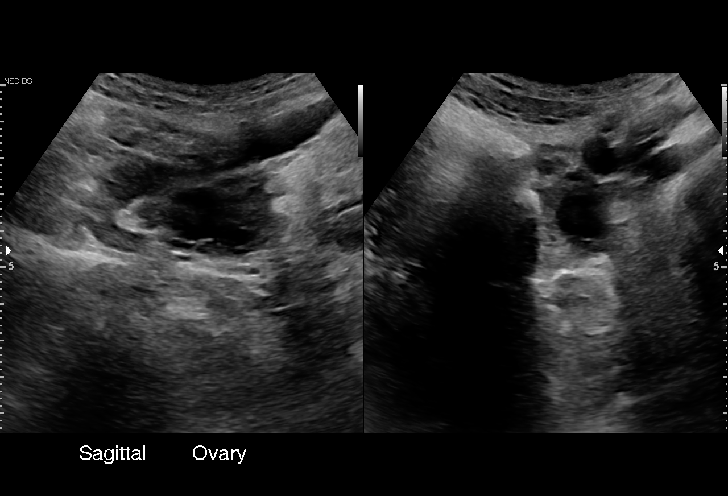
[im 16/17]
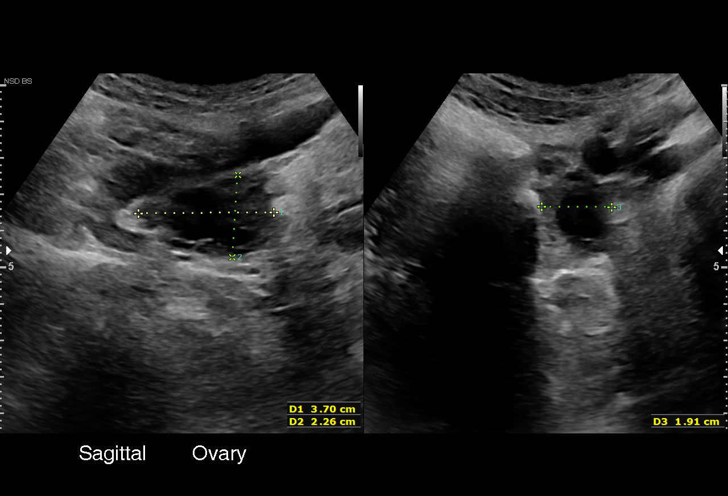
[im 17/17]
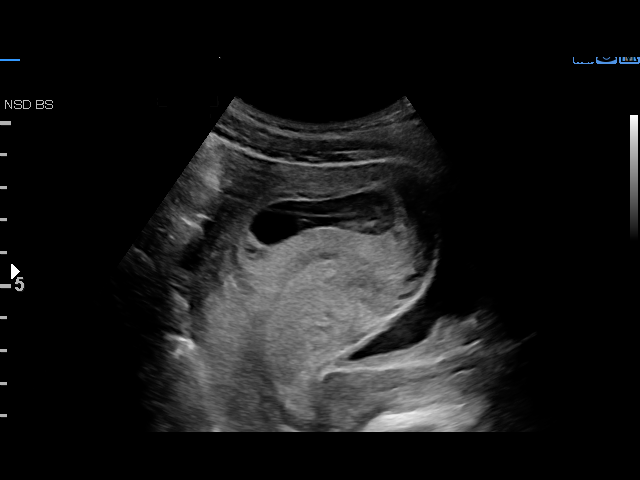

[15 of 17 positions shown; findings below may reference images not displayed]

FINDINGS: Intrauterine gestational sac: Single

Yolk sac:  Visualized.

Embryo:  Visualized.

Cardiac Activity: Visualized.

Heart Rate: 152 bpm

CRL:   24.1 mm   9 w 1 d                  US EDC: 08/19/2019

Subchorionic hemorrhage:  None visualized.

Maternal uterus/adnexae: There is a probable corpus luteum cyst seen
within the left ovary. The right ovary is unremarkable.
IMPRESSION: Single live intrauterine pregnancy measuring 9 weeks 1 day

## 2022-01-07 ENCOUNTER — Other Ambulatory Visit: Payer: Self-pay

## 2022-01-07 ENCOUNTER — Encounter (HOSPITAL_COMMUNITY): Payer: Self-pay

## 2022-01-07 ENCOUNTER — Inpatient Hospital Stay (HOSPITAL_COMMUNITY)
Admission: AD | Admit: 2022-01-07 | Discharge: 2022-01-07 | Disposition: A | Discharge status: Home / Self Care | Payer: Medicaid Other | Attending: Obstetrics & Gynecology | Admitting: Obstetrics & Gynecology

## 2022-01-07 DIAGNOSIS — Z3A16 16 weeks gestation of pregnancy: Secondary | ICD-10-CM | POA: Insufficient documentation

## 2022-01-07 DIAGNOSIS — N949 Unspecified condition associated with female genital organs and menstrual cycle: Secondary | ICD-10-CM | POA: Diagnosis not present

## 2022-01-07 DIAGNOSIS — O26892 Other specified pregnancy related conditions, second trimester: Secondary | ICD-10-CM | POA: Insufficient documentation

## 2022-01-07 DIAGNOSIS — R109 Unspecified abdominal pain: Secondary | ICD-10-CM | POA: Insufficient documentation

## 2022-01-07 LAB — URINALYSIS, ROUTINE W REFLEX MICROSCOPIC
Bilirubin Urine: NEGATIVE
Glucose, UA: NEGATIVE mg/dL
Hgb urine dipstick: NEGATIVE
Ketones, ur: NEGATIVE mg/dL
Nitrite: NEGATIVE
Protein, ur: NEGATIVE mg/dL
Specific Gravity, Urine: 1.023 (ref 1.005–1.030)
pH: 6 (ref 5.0–8.0)

## 2022-01-07 LAB — CBC
HCT: 31.6 % — ABNORMAL LOW (ref 36.0–46.0)
Hemoglobin: 10.9 g/dL — ABNORMAL LOW (ref 12.0–15.0)
MCH: 32.5 pg (ref 26.0–34.0)
MCHC: 34.5 g/dL (ref 30.0–36.0)
MCV: 94.3 fL (ref 80.0–100.0)
Platelets: 319 10*3/uL (ref 150–400)
RBC: 3.35 MIL/uL — ABNORMAL LOW (ref 3.87–5.11)
RDW: 12.6 % (ref 11.5–15.5)
WBC: 11.7 10*3/uL — ABNORMAL HIGH (ref 4.0–10.5)
nRBC: 0 % (ref 0.0–0.2)

## 2022-01-07 LAB — WET PREP, GENITAL
Clue Cells Wet Prep HPF POC: NONE SEEN
Sperm: NONE SEEN
Trich, Wet Prep: NONE SEEN
WBC, Wet Prep HPF POC: >=10 — AB (ref <10)
Yeast Wet Prep HPF POC: NONE SEEN

## 2022-01-07 LAB — HCG, QUANTITATIVE, PREGNANCY: hCG, Beta Chain, Quant, S: 18831 m[IU]/mL — ABNORMAL HIGH (ref <5)

## 2022-01-07 LAB — POCT PREGNANCY, URINE: Preg Test, Ur: POSITIVE — AB

## 2022-01-07 NOTE — MAU Provider Note (Addendum)
History     CSN: [id number]  Arrival date and time: 01/07/22 1749   Event Date/Time   First Provider Initiated Contact with Patient 01/07/22 1945      Chief Complaint  Patient presents with   Abdominal Pain   Kamila TRACEY HERMANCE , a  21 y.o. [id number] at [redacted]w[redacted]d presents to MAU with complaints of abdominal "pressure" for the last 3 days. Patient states that is has been inconsistent.Patient states she notices it more at night with "rolling over" or sitting up in the middle of the night. Patient states she had a "preterm baby" earlier this year and is just concerned. Denies vaginal bleeding leaking of, and abnormal vaginal discharge. Denies urinary symptoms. Patient endorses fetal movement. Patient not actively having pain, but states when pain occurs 6/10. Attempted to relieve tylenol 2 days ago without relief.         OB History     Gravida  3   Para  2   Term  1   Preterm  1   AB      Living  2      SAB      IAB      Ectopic      Multiple      Live Births  2           Past Medical History:  Diagnosis Date   Medical history non-contributory     Past Surgical History:  Procedure Laterality Date   MOUTH SURGERY      Family History  Problem Relation Age of Onset   Hypertension Mother    Hypertension Maternal Grandfather     Social History   Tobacco Use   Smoking status: Never   Smokeless tobacco: Never  Vaping Use   Vaping Use: Every day  Substance Use Topics   Alcohol use: Not Currently    Comment: stopped after finding out she was pregnant   Drug use: Yes    Types: Marijuana    Comment: 1 PUFF 2 DAYS AGO    Allergies: No Known Allergies  Medications Prior to Admission  Medication Sig Dispense Refill Last Dose   Prenatal Vit-Fe Fumarate-FA (PREPLUS) 27-1 MG TABS Take 1 tablet by mouth daily. 30 tablet 13     Review of Systems  Constitutional:  Negative for chills, fatigue and fever.  Eyes:  Negative for pain and visual disturbance.   Respiratory:  Negative for apnea, shortness of breath and wheezing.   Cardiovascular:  Negative for chest pain and palpitations.  Gastrointestinal:  Positive for abdominal pain. Negative for constipation, diarrhea, nausea and vomiting.  Genitourinary:  Negative for difficulty urinating, dysuria, pelvic pain, vaginal bleeding, vaginal discharge and vaginal pain.  Musculoskeletal:  Negative for back pain.  Neurological:  Negative for seizures, weakness and headaches.  Psychiatric/Behavioral:  Negative for suicidal ideas.    Physical Exam   Blood pressure 110/63, pulse 93, temperature 98.2 F (36.8 C), temperature source Oral, resp. rate 16, height 5\' 6"  (1.676 m), weight 60.9 kg, last menstrual period 08/30/2021, SpO2 95 %, unknown if currently breastfeeding.  Physical Exam Vitals and nursing note reviewed.  Constitutional:      General: She is not in acute distress.    Appearance: Normal appearance.  HENT:     Head: Normocephalic.  Pulmonary:     Effort: Pulmonary effort is normal.  Musculoskeletal:     Cervical back: Normal range of motion.  Skin:    General: Skin is warm and dry.  Neurological:     Mental Status: She is alert and oriented to person, place, and time.  Psychiatric:        Mood and Affect: Mood normal.     MAU Course  Procedures Orders Placed This Encounter  Procedures   Wet prep, genital   US OB LESS THAN 14 WEEKS WITH OB TRANSVAGINAL   Urinalysis, Routine w reflex microscopic   CBC   hCG, quantitative, pregnancy   Diet NPO time specified   Pregnancy, urine POC   ABO/Rh   Lab results reviewed and interpreted by me.   MDM  Pain intermittent and more associated with Movement at night. Consistent with Round Ligament.   Report given to Sabas Sous, CNM @ 2036.   Shantonette Danella Deis) Suzie Portela, MSN, CNM  Center for [facility]  01/07/22 8:37 PM   Assessment and Plan  Reassessment (9:01 PM)  Patient informed that the ultrasound is considered a  limited OB ultrasound and is not intended to be a complete ultrasound exam.  Patient also informed that the ultrasound is not being completed with the intent of assessing for fetal or placental anomalies or any pelvic abnormalities.  Explained that the purpose of today's ultrasound is to assess for  viability.  Patient acknowledges the purpose of the exam and the limitations of the study.  -SIUP at 16.1 weeks by BPD.  FHR at 154. -Good fetal movement noted.  -Reviewed findings.  Informed that formal anatomy scan would be ordered for the next 2-3 weeks.  -Nurse to collect wet prep and GC/CT.  -Patient informed that results will be released to mychart. -Patient offered and declines pain medication. -No questions currently. -Encouraged to call primary office or return to MAU if symptoms worsen or with the onset of new symptoms. -Discharged to home in stable condition.  Cherre Robins MSN, CNM Advanced Practice Provider, Center for [employer]

## 2022-01-07 NOTE — MAU Note (Signed)
Heather Hoffman is a 21 y.o. at [redacted]w[redacted]d here in MAU reporting: lower abdominal pain for the past 3 days intermittently. No bleeding, no LOF. Is concerned because she just had a preterm baby  LMP: 09/07/2021  Onset of complaint: ongoing  Pain score: 4/10  Vitals:   01/07/22 1831  BP: 110/63  Pulse: 93  Resp: 16  Temp: 98.2 F (36.8 C)  SpO2: 95%     FHT:154  Lab orders placed from triage: UA, UPT

## 2022-01-08 ENCOUNTER — Telehealth: Payer: Self-pay | Admitting: Certified Nurse Midwife

## 2022-01-08 ENCOUNTER — Other Ambulatory Visit: Payer: Self-pay | Admitting: Certified Nurse Midwife

## 2022-01-08 LAB — GC/CHLAMYDIA PROBE AMP (~~LOC~~) NOT AT ARMC
Chlamydia: POSITIVE — AB
Comment: NEGATIVE
Comment: NORMAL
Neisseria Gonorrhea: NEGATIVE

## 2022-01-08 LAB — ABO/RH: ABO/RH(D): A POS

## 2022-01-08 MED ORDER — AZITHROMYCIN 500 MG PO TABS: 1000.0000 mg | ORAL_TABLET | Freq: Once | ORAL | 0 refills | Status: AC

## 2022-01-08 NOTE — Telephone Encounter (Signed)
2 Patient identifiers colleted prior to results read. Discussed that patient was positive for Chlamydia and that treatment will be sent to [facility] listed on file. Reviewed that partner should be notified and treated as well. Also recommended to abstain from intercourse 7-14 days after both herself and her partner have completed treatment. Patient verbalized understanding.   Heather Hoffman) Heather Portela, MSN, CNM  [facility] for [facility]  01/08/22 4:16 PM

## 2022-01-26 NOTE — L&D Delivery Note (Signed)
OB/GYN Faculty Practice Delivery Note  Heather Hoffman is a 22 y.o. [id number] s/p vag delivery at [redacted]w[redacted]d. She was admitted for PROM @ 0730 on 06/10/22.   ROM: 21h 78m with clear fluid GBS Status: PCR neg Maximum Maternal Temperature: 98.3  Labor Progress: [name] arrived on the evening of 5/15 having PROM @ 0730 and rare ctx; once on L&D she had AROM of a forebag followed by progression into active labor then delivery.  Delivery Date/Time: May 16th, 2024 at 0455 Delivery: Called to room for delivery. Head delivered ROA after one push. No nuchal cord present. Shoulder and body delivered in usual fashion. Infant with spontaneous cry, placed on mother's abdomen, dried and stimulated. Cord clamped x 2 after 1-minute delay, and cut by patient. Cord blood drawn. Placenta delivered spontaneously with gentle cord traction. Fundus firm with massage and Pitocin. Labia, perineum, vagina, and cervix inspected and found to be intact. She expressed a desire for a postplacental IUD however her cervix was closed and I was unable to place the Mirena IUD as it was already taken out of the package.  Placenta: spont, intact Complications: none Lacerations: none EBL: 273cc Analgesia: epidural  Postpartum Planning [x]  message to sent to schedule follow-up, including IUD placement (4wks) and colpo (8wks) [x]  SW consult ordered for having a single PNV  Infant: girl  APGARs 9/9  2863g (6lb 5oz)  Arabella Merles, CNM  06/11/2022 5:29 AM

## 2022-02-11 ENCOUNTER — Encounter: Payer: Self-pay | Admitting: Family Medicine

## 2022-02-11 ENCOUNTER — Ambulatory Visit (INDEPENDENT_AMBULATORY_CARE_PROVIDER_SITE_OTHER): Payer: Medicaid Other | Admitting: Family Medicine

## 2022-02-11 ENCOUNTER — Other Ambulatory Visit (HOSPITAL_COMMUNITY)
Admission: RE | Admit: 2022-02-11 | Discharge: 2022-02-11 | Disposition: A | Discharge status: Home / Self Care | Payer: Medicaid Other | Source: Ambulatory Visit | Attending: Family Medicine | Admitting: Family Medicine

## 2022-02-11 ENCOUNTER — Encounter: Payer: Self-pay | Admitting: General Practice

## 2022-02-11 VITALS — BP 104/67 | HR 89 | Wt 137.0 lb

## 2022-02-11 DIAGNOSIS — O0990 Supervision of high risk pregnancy, unspecified, unspecified trimester: Secondary | ICD-10-CM | POA: Diagnosis present

## 2022-02-11 DIAGNOSIS — O09899 Supervision of other high risk pregnancies, unspecified trimester: Secondary | ICD-10-CM | POA: Insufficient documentation

## 2022-02-11 DIAGNOSIS — Z3482 Encounter for supervision of other normal pregnancy, second trimester: Secondary | ICD-10-CM

## 2022-02-11 DIAGNOSIS — O98812 Other maternal infectious and parasitic diseases complicating pregnancy, second trimester: Secondary | ICD-10-CM | POA: Diagnosis not present

## 2022-02-11 DIAGNOSIS — Z3A21 21 weeks gestation of pregnancy: Secondary | ICD-10-CM

## 2022-02-11 DIAGNOSIS — O09892 Supervision of other high risk pregnancies, second trimester: Secondary | ICD-10-CM

## 2022-02-11 DIAGNOSIS — Z3480 Encounter for supervision of other normal pregnancy, unspecified trimester: Secondary | ICD-10-CM

## 2022-02-11 DIAGNOSIS — A749 Chlamydial infection, unspecified: Secondary | ICD-10-CM

## 2022-02-11 NOTE — Progress Notes (Signed)
Subjective:  Heather Hoffman is a [id number] [redacted]w[redacted]d being seen today for her first obstetrical visit.  Her obstetrical history is significant for  full term pregnancy with first pregnancy, then PPROM with delivery at 26 weeks with second. Chlamydia infection preceeded PPROM. Both deliveries were at [facility]. Her last delivery was 03/25/21.  Patient does intend to breast feed. Pregnancy history fully reviewed.  Patient reports no complaints.  BP 104/67   Pulse 89   Wt 137 lb (62.1 kg)   LMP 08/30/2021   BMI 22.11 kg/m   HISTORY: OB History  Gravida Para Term Preterm AB Living  3 2 1 1   2   SAB IAB Ectopic Multiple Live Births          2    # Outcome Date GA Lbr Len/2nd Weight Sex Delivery Anes PTL Lv  3 Current           2 Preterm 03/25/21 [redacted]w[redacted]d   M Vag-Spont   LIV  1 Term 08/07/19 [redacted]w[redacted]d   F Vag-Spont   LIV    Past Medical History:  Diagnosis Date   Medical history non-contributory     Past Surgical History:  Procedure Laterality Date   MOUTH SURGERY      Family History  Problem Relation Age of Onset   Hypertension Mother    Hypertension Maternal Grandfather      Exam  BP 104/67   Pulse 89   Wt 137 lb (62.1 kg)   LMP 08/30/2021   BMI 22.11 kg/m   Chaperone present during exam  CONSTITUTIONAL: Well-developed, well-nourished female in no acute distress.  HENT:  Normocephalic, atraumatic, External right and left ear normal. Oropharynx is clear and moist EYES: Conjunctivae and EOM are normal. Pupils are equal, round, and reactive to light. No scleral icterus.  NECK: Normal range of motion, supple, no masses.  Normal thyroid.  CARDIOVASCULAR: Normal heart rate noted, regular rhythm RESPIRATORY: Clear to auscultation bilaterally. Effort and breath sounds normal, no problems with respiration noted. BREASTS: Symmetric in size. No masses, skin changes, nipple drainage, or lymphadenopathy. ABDOMEN: Soft, normal bowel sounds, no distention noted.  No tenderness, rebound  or guarding.  PELVIC: Normal appearing external genitalia; normal appearing vaginal mucosa and cervix. No abnormal discharge noted. Normal uterine size, no other palpable masses, no uterine or adnexal tenderness. MUSCULOSKELETAL: Normal range of motion. No tenderness.  No cyanosis, clubbing, or edema.  2+ distal pulses. SKIN: Skin is warm and dry. No rash noted. Not diaphoretic. No erythema. No pallor. NEUROLOGIC: Alert and oriented to person, place, and time. Normal reflexes, muscle tone coordination. No cranial nerve deficit noted. PSYCHIATRIC: Normal mood and affect. Normal behavior. Normal judgment and thought content.    Assessment:    Pregnancy: [id number] Patient Active Problem List   Diagnosis Date Noted   Supervision of other normal pregnancy, antepartum 03/10/2021   Placenta previa in second trimester 03/10/2021   Preterm premature rupture of membranes (PPROM) with unknown onset of labor 02/19/2021   Positive RPR test 06/27/2019   Anemia in pregnancy 06/27/2019   Abnormal genetic test 03/14/2019   Chlamydia infection during pregnancy, antepartum 02/07/2019   Supervision of normal first pregnancy, antepartum 02/02/2019   Chlamydia 01/19/2019   UTI (urinary tract infection) 01/19/2019   Underweight 11/29/2017   Adjustment insomnia 04/09/2017   Severe episode of recurrent major depressive disorder, without psychotic features ([place]) 03/18/2017   Intractable migraine with aura without status migrainosus 03/31/2016      Plan:  1. Supervision of high risk pregnancy, antepartum FHT and FH normal GC/CT today. - CBC/D/Plt+RPR+Rh+ABO+RubIgG... - Hepatitis C Antibody - Cytology - PAP( [place]) - Urine Culture - [place] MFM OB DETAIL +14 WK; Future - Enroll Patient in PreNatal Babyscripts  2. History of preterm delivery, currently pregnant Due to PPROM - likely secondary to infection Will get CL. - CBC/D/Plt+RPR+Rh+ABO+RubIgG... - Hepatitis C Antibody - Cytology - PAP( [facility]) - Urine Culture - [place] MFM OB DETAIL +14 WK; Future - Enroll Patient in PreNatal Babyscripts  3. Short interval between pregnancies affecting pregnancy, antepartum - CBC/D/Plt+RPR+Rh+ABO+RubIgG... - Hepatitis C Antibody - Cytology - PAP( [place]) - Urine Culture - [place] MFM OB DETAIL +14 WK; Future - Enroll Patient in PreNatal Babyscripts  4. Encounter for supervision of other normal pregnancy in second trimester - Panorama Prenatal Test Full Panel   5. Chlamydia infection during pregnancy, antepartum Treated. TOC today.    Initial labs obtained Continue prenatal vitamins Reviewed n/v relief measures and warning s/s to report Reviewed recommended weight gain based on pre-gravid BMI Encouraged well-balanced diet Genetic & carrier screening discussed: requests Panorama,  Ultrasound discussed; fetal survey: requested [place] completed> form faxed if has or is planning to apply for medicaid The nature of Heather Hoffman for [employer] with multiple MDs and other Advanced Practice Providers was explained to patient; also emphasized that fellows, residents, and students are part of our team.  No indication for ASA 81mg  or early GDM testing.  Problem list reviewed and updated. 75% of 30 min visit spent on counseling and coordination of care.    Truett Mainland 02/11/2022

## 2022-02-12 LAB — CBC/D/PLT+RPR+RH+ABO+RUBIGG...
Antibody Screen: NEGATIVE
Basophils Absolute: 0 10*3/uL (ref 0.0–0.2)
Basos: 0 %
EOS (ABSOLUTE): 0.1 10*3/uL (ref 0.0–0.4)
Eos: 1 %
HCV Ab: NONREACTIVE
HIV Screen 4th Generation wRfx: NONREACTIVE
Hematocrit: 34.3 % (ref 34.0–46.6)
Hemoglobin: 11.5 g/dL (ref 11.1–15.9)
Hepatitis B Surface Ag: NEGATIVE
Immature Grans (Abs): 0 10*3/uL (ref 0.0–0.1)
Immature Granulocytes: 0 %
Lymphocytes Absolute: 1.7 10*3/uL (ref 0.7–3.1)
Lymphs: 13 %
MCH: 32.7 pg (ref 26.6–33.0)
MCHC: 33.5 g/dL (ref 31.5–35.7)
MCV: 97 fL (ref 79–97)
Monocytes Absolute: 0.5 10*3/uL (ref 0.1–0.9)
Monocytes: 4 %
Neutrophils Absolute: 10.2 10*3/uL — ABNORMAL HIGH (ref 1.4–7.0)
Neutrophils: 82 %
Platelets: 238 10*3/uL (ref 150–450)
RBC: 3.52 x10E6/uL — ABNORMAL LOW (ref 3.77–5.28)
RDW: 13 % (ref 11.7–15.4)
RPR Ser Ql: NONREACTIVE
Rh Factor: POSITIVE
Rubella Antibodies, IGG: 2.04 index (ref >0.99)
WBC: 12.5 10*3/uL — ABNORMAL HIGH (ref 3.4–10.8)

## 2022-02-12 LAB — SPECIMEN STATUS REPORT

## 2022-02-12 LAB — HCV INTERPRETATION

## 2022-02-12 LAB — HEPATITIS C ANTIBODY: Hep C Virus Ab: NONREACTIVE

## 2022-02-13 LAB — URINE CULTURE: Organism ID, Bacteria: NO GROWTH

## 2022-02-16 LAB — CYTOLOGY - PAP
Chlamydia: NEGATIVE
Comment: NEGATIVE
Comment: NORMAL
Diagnosis: HIGH — AB
Neisseria Gonorrhea: NEGATIVE

## 2022-02-20 LAB — PANORAMA PRENATAL TEST FULL PANEL:PANORAMA TEST PLUS 5 ADDITIONAL MICRODELETIONS: FETAL FRACTION: 8.9

## 2022-02-26 ENCOUNTER — Ambulatory Visit: Payer: Medicaid Other

## 2022-02-26 ENCOUNTER — Encounter: Payer: Medicaid Other | Admitting: Family Medicine

## 2022-03-10 ENCOUNTER — Encounter: Payer: Medicaid Other | Admitting: Advanced Practice Midwife

## 2022-03-19 ENCOUNTER — Ambulatory Visit: Payer: Medicaid Other | Admitting: *Deleted

## 2022-03-19 ENCOUNTER — Ambulatory Visit: Payer: Medicaid Other | Attending: Family Medicine

## 2022-03-19 ENCOUNTER — Other Ambulatory Visit: Payer: Self-pay | Admitting: *Deleted

## 2022-03-19 ENCOUNTER — Encounter: Payer: Self-pay | Admitting: Family Medicine

## 2022-03-19 ENCOUNTER — Encounter: Payer: Self-pay | Admitting: *Deleted

## 2022-03-19 VITALS — BP 118/68 | HR 74

## 2022-03-19 DIAGNOSIS — O09292 Supervision of pregnancy with other poor reproductive or obstetric history, second trimester: Secondary | ICD-10-CM | POA: Diagnosis not present

## 2022-03-19 DIAGNOSIS — O409XX0 Polyhydramnios, unspecified trimester, not applicable or unspecified: Secondary | ICD-10-CM | POA: Insufficient documentation

## 2022-03-19 DIAGNOSIS — O0990 Supervision of high risk pregnancy, unspecified, unspecified trimester: Secondary | ICD-10-CM | POA: Insufficient documentation

## 2022-03-19 DIAGNOSIS — O0932 Supervision of pregnancy with insufficient antenatal care, second trimester: Secondary | ICD-10-CM | POA: Diagnosis not present

## 2022-03-19 DIAGNOSIS — O09212 Supervision of pregnancy with history of pre-term labor, second trimester: Secondary | ICD-10-CM

## 2022-03-19 DIAGNOSIS — Z3A27 27 weeks gestation of pregnancy: Secondary | ICD-10-CM | POA: Diagnosis not present

## 2022-03-19 DIAGNOSIS — Z3492 Encounter for supervision of normal pregnancy, unspecified, second trimester: Secondary | ICD-10-CM

## 2022-03-19 DIAGNOSIS — O09899 Supervision of other high risk pregnancies, unspecified trimester: Secondary | ICD-10-CM | POA: Insufficient documentation

## 2022-03-19 DIAGNOSIS — Z8759 Personal history of other complications of pregnancy, childbirth and the puerperium: Secondary | ICD-10-CM

## 2022-04-02 ENCOUNTER — Telehealth: Payer: Self-pay | Admitting: General Practice

## 2022-04-02 ENCOUNTER — Encounter: Payer: Medicaid Other | Admitting: Family Medicine

## 2022-04-02 NOTE — Telephone Encounter (Signed)
Patient missed OB appointment today.  Left message message on VM offering an appt at 3:50pm.  Asked patient to contact our office.

## 2022-04-16 ENCOUNTER — Encounter: Payer: Medicaid Other | Admitting: Family Medicine

## 2022-04-23 ENCOUNTER — Ambulatory Visit: Payer: Medicaid Other

## 2022-04-23 ENCOUNTER — Ambulatory Visit: Payer: Medicaid Other | Attending: Maternal & Fetal Medicine

## 2022-06-10 ENCOUNTER — Inpatient Hospital Stay (HOSPITAL_COMMUNITY)
Admission: AD | Admit: 2022-06-10 | Discharge: 2022-06-12 | DRG: 807 | Disposition: A | Discharge status: Home / Self Care | Payer: Medicaid Other | Attending: Obstetrics and Gynecology | Admitting: Obstetrics and Gynecology

## 2022-06-10 ENCOUNTER — Encounter (HOSPITAL_COMMUNITY): Payer: Self-pay | Admitting: Obstetrics & Gynecology

## 2022-06-10 DIAGNOSIS — Z8759 Personal history of other complications of pregnancy, childbirth and the puerperium: Secondary | ICD-10-CM

## 2022-06-10 DIAGNOSIS — Z3A38 38 weeks gestation of pregnancy: Secondary | ICD-10-CM

## 2022-06-10 DIAGNOSIS — O403XX0 Polyhydramnios, third trimester, not applicable or unspecified: Secondary | ICD-10-CM | POA: Diagnosis present

## 2022-06-10 DIAGNOSIS — O4290 Premature rupture of membranes, unspecified as to length of time between rupture and onset of labor, unspecified weeks of gestation: Secondary | ICD-10-CM | POA: Diagnosis present

## 2022-06-10 DIAGNOSIS — O0930 Supervision of pregnancy with insufficient antenatal care, unspecified trimester: Secondary | ICD-10-CM

## 2022-06-10 DIAGNOSIS — Z3480 Encounter for supervision of other normal pregnancy, unspecified trimester: Secondary | ICD-10-CM

## 2022-06-10 DIAGNOSIS — O409XX0 Polyhydramnios, unspecified trimester, not applicable or unspecified: Secondary | ICD-10-CM | POA: Diagnosis present

## 2022-06-10 DIAGNOSIS — O4292 Full-term premature rupture of membranes, unspecified as to length of time between rupture and onset of labor: Principal | ICD-10-CM | POA: Diagnosis present

## 2022-06-10 DIAGNOSIS — R87619 Unspecified abnormal cytological findings in specimens from cervix uteri: Secondary | ICD-10-CM | POA: Diagnosis present

## 2022-06-10 LAB — POCT FERN TEST: POCT Fern Test: POSITIVE

## 2022-06-10 NOTE — H&P (Signed)
Heather Hoffman is a 22 y.o. G81P1102 female at [redacted]w[redacted]d by 16.1wk u/s presenting with leaking fluid since 0730.  Rare mild ctx since then. Reports active fetal movement, contractions: irreg/mild, vaginal bleeding: none, membranes: ruptured, clear fluid.  Initiated prenatal care at [facility] at 21 wks.   Most recent u/s : [redacted]w[redacted]d, EFW 30%, poly (pocket 10.75cm), cephalic, ant placenta.   This pregnancy complicated by: # polyhydramnios dx at 27wks # minimal prenatal care> single visit @ 21wks # abnl Pap (HSIL) without colpo # +chlam Dec with neg TOC Jan 2024  Prenatal History/Complications:  # hx PPROM with 26wk delivery  Past Medical History: Past Medical History:  Diagnosis Date   Medical history non-contributory     Past Surgical History: Past Surgical History:  Procedure Laterality Date   MOUTH SURGERY      Obstetrical History: OB History     Gravida  3   Para  2   Term  1   Preterm  1   AB      Living  2      SAB      IAB      Ectopic      Multiple      Live Births  2           Social History: Social History   Socioeconomic History   Marital status: Single    Spouse name: Not on file   Number of children: 2   Years of education: Not on file   Highest education level: Not on file  Occupational History   Not on file  Tobacco Use   Smoking status: Never   Smokeless tobacco: Current  Vaping Use   Vaping Use: Every day   Substances: Nicotine  Substance and Sexual Activity   Alcohol use: Not Currently    Comment: stopped after finding out she was pregnant   Drug use: Not Currently    Types: Marijuana    Comment: 1 PUFF 2 DAYS AGO   Sexual activity: Not Currently    Birth control/protection: None  Other Topics Concern   Not on file  Social History Narrative   Not on file   Social Determinants of Health   Financial Resource Strain: Not on file  Food Insecurity: Not on file  Transportation Needs: Not on file  Physical Activity: Not on file   Stress: Not on file  Social Connections: Not on file    Family History: Family History  Problem Relation Age of Onset   Hypertension Mother    Hypertension Maternal Grandfather     Allergies: No Known Allergies  Medications Prior to Admission  Medication Sig Dispense Refill Last Dose   Prenatal Vit-Fe Fumarate-FA (PREPLUS) 27-1 MG TABS Take 1 tablet by mouth daily. 30 tablet 13     Review of Systems  Pertinent pos/neg as indicated in HPI  Blood pressure 125/71, pulse 84, temperature 98.3 F (36.8 C), temperature source Oral, resp. rate 18, height 5' 6.5" (1.689 m), weight 73.3 kg, last menstrual period 08/30/2021, SpO2 95 %, unknown if currently breastfeeding. General appearance: alert, cooperative, and no distress Lungs: clear to auscultation bilaterally Heart: regular rate and rhythm Abdomen: gravid, soft, non-tender, EFW by Leopold's approximately 7lbs Extremities: tr edema  Fetal monitoring: FHR: 125 bpm, variability: moderate,  Accelerations: Present,  decelerations:  Absent Uterine activity: Frequency: Every 2-3 minutes Pelvic: 3-4cm/50%/vtx -2; AROM for clear fluid   Presentation:  cephalic by bedside u/s   Prenatal labs: ABO, Rh: --/--/PENDING (  05/15 2357) Antibody: PENDING (05/15 2357) Rubella: 2.04 (01/17 0000) RPR: Non Reactive (01/17 0000)  HBsAg: Negative (01/17 0000)  HIV: Non Reactive (01/17 0000)  GBS:   PCR pending 2hr GTT: not done  Prenatal Transfer Tool  Maternal Diabetes: No Genetic Screening: Normal Maternal Ultrasounds/Referrals: Normal Fetal Ultrasounds or other Referrals:  None (polyhydramnios @ 27wks) Maternal Substance Abuse:  No Significant Maternal Medications:  None Significant Maternal Lab Results: None (GBS PCR pending)  Results for orders placed or performed during the hospital encounter of 06/10/22 (from the past 24 hour(s))  Fern Test   Collection Time: 06/10/22 11:38 PM  Result Value Ref Range   POCT Fern Test Positive  = ruptured amniotic membanes   CBC   Collection Time: 06/10/22 11:57 PM  Result Value Ref Range   WBC 10.4 4.0 - 10.5 K/uL   RBC 3.40 (L) 3.87 - 5.11 MIL/uL   Hemoglobin 9.8 (L) 12.0 - 15.0 g/dL   HCT 16.1 (L) 09.6 - 04.5 %   MCV 89.7 80.0 - 100.0 fL   MCH 28.8 26.0 - 34.0 pg   MCHC 32.1 30.0 - 36.0 g/dL   RDW 40.9 81.1 - 91.4 %   Platelets 200 150 - 400 K/uL   nRBC 0.0 0.0 - 0.2 %  Type and screen [facility]   Collection Time: 06/10/22 11:57 PM  Result Value Ref Range   ABO/RH(D) PENDING    Antibody Screen PENDING    Sample Expiration      06/13/2022,2359 Performed at [facility], [address], [place], [place] [address]      Assessment:  [redacted]w[redacted]d SIUP  G3P1102  PROM x 16h  Cat 1 FHR  GBS PCR pending   Plan:  Admit to L&D  IV pain meds/epidural prn active labor  Forebag AROM for clear fluid; will manage expectantly and start Pit prn  Anticipate vag delivery  GC/chlam pending   Plans to breast and bottlefeed  Contraception: postplacental IUD   Arabella Merles CNM 06/11/2022, 12:34 AM

## 2022-06-10 NOTE — H&P (Incomplete)
Heather Hoffman is a 22 y.o. G86P1102 female at [redacted]w[redacted]d by 16.1wk u/s presenting with leaking fluid since 0730.  Rare mild ctx since then. Reports active fetal movement, contractions: irreg/mild, vaginal bleeding: none, membranes: ruptured, clear fluid.  Initiated prenatal care at [facility] at 21 wks.   Most recent u/s : [redacted]w[redacted]d, EFW 30%, poly (pocket 10.75cm), cephalic, ant placenta.   This pregnancy complicated by: # polyhydramnios dx at 27wks # minimal prenatal care> single visit @ 21wks # abnl Pap (HSIL) without colpo  Prenatal History/Complications:  # hx PPROM with 26wk delivery  Past Medical History: Past Medical History:  Diagnosis Date  . Medical history non-contributory     Past Surgical History: Past Surgical History:  Procedure Laterality Date  . MOUTH SURGERY      Obstetrical History: OB History     Gravida  3   Para  2   Term  1   Preterm  1   AB      Living  2      SAB      IAB      Ectopic      Multiple      Live Births  2           Social History: Social History   Socioeconomic History  . Marital status: Single    Spouse name: Not on file  . Number of children: 2  . Years of education: Not on file  . Highest education level: Not on file  Occupational History  . Not on file  Tobacco Use  . Smoking status: Never  . Smokeless tobacco: Current  Vaping Use  . Vaping Use: Every day  . Substances: Nicotine  Substance and Sexual Activity  . Alcohol use: Not Currently    Comment: stopped after finding out she was pregnant  . Drug use: Not Currently    Types: Marijuana    Comment: 1 PUFF 2 DAYS AGO  . Sexual activity: Not Currently    Birth control/protection: None  Other Topics Concern  . Not on file  Social History Narrative  . Not on file   Social Determinants of Health   Financial Resource Strain: Not on file  Food Insecurity: Not on file  Transportation Needs: Not on file  Physical Activity: Not on file  Stress: Not on  file  Social Connections: Not on file    Family History: Family History  Problem Relation Age of Onset  . Hypertension Mother   . Hypertension Maternal Grandfather     Allergies: No Known Allergies  Medications Prior to Admission  Medication Sig Dispense Refill Last Dose  . Prenatal Vit-Fe Fumarate-FA (PREPLUS) 27-1 MG TABS Take 1 tablet by mouth daily. 30 tablet 13     Review of Systems  Pertinent pos/neg as indicated in HPI  Blood pressure 125/71, pulse 84, temperature 98.3 F (36.8 C), temperature source Oral, resp. rate 18, height 5' 6.5" (1.689 m), weight 73.3 kg, last menstrual period 08/30/2021, SpO2 95 %, unknown if currently breastfeeding. General appearance: alert, cooperative, and no distress Lungs: clear to auscultation bilaterally Heart: regular rate and rhythm Abdomen: gravid, soft, non-tender, EFW by Leopold's approximately 7lbs Extremities: tr edema  Fetal monitoring: FHR: 125 bpm, variability: moderate,  Accelerations: Present,  decelerations:  Absent Uterine activity: Frequency: Every 2-3 minutes   Presentation:  cephalic by bedside u/s   Prenatal labs: ABO, Rh: A/Positive/-- (01/17 0000) Antibody: Negative (01/17 0000) Rubella: 2.04 (01/17 0000) RPR: Non Reactive (01/17 0000)  HBsAg: Negative (01/17 0000)  HIV: Non Reactive (01/17 0000)  GBS:   PCR pending 2hr GTT: not done  Prenatal Transfer Tool  Maternal Diabetes: No Genetic Screening: Normal Maternal Ultrasounds/Referrals: Normal Fetal Ultrasounds or other Referrals:  None (polyhydramnios @ 27wks) Maternal Substance Abuse:  No Significant Maternal Medications:  None Significant Maternal Lab Results: None (GBS PCR pending)  Results for orders placed or performed during the hospital encounter of 06/10/22 (from the past 24 hour(s))  Fern Test   Collection Time: 06/10/22 11:38 PM  Result Value Ref Range   POCT Fern Test Positive = ruptured amniotic membanes      Assessment:  [redacted]w[redacted]d  SIUP  G3P1102  PROM x 16h  Cat 1 FHR  GBS PCR pending   Plan:  Admit to L&D  IV pain meds/epidural prn active labor  Begin induction for PROM with cytotec/cervical foley/Pit depending on cx exam  Anticipate vag delivery   Plans to ***feed  Contraception: ***  Circumcision: ***  Arabella Merles CNM 06/10/2022, 11:52 PM

## 2022-06-10 NOTE — MAU Note (Signed)
Fahmida MAZIKEEN COODY is a 22 y.o. at [redacted]w[redacted]d here in MAU reporting: possible ROM at 0730 this morning. Pt states it was clear fluid with no odor. Pt states she's had about 4 ctx today. Pt states she is not in any pain right now. Pt denies VB. +FM   Onset of complaint: 0730 06/10/2022 Pain score: none  Vitals:   06/10/22 2324  BP: 128/66  Pulse: 89  Resp: 18  Temp: 98.3 F (36.8 C)  SpO2: 95%     FHT:129 Lab orders placed from triage: fern

## 2022-06-11 ENCOUNTER — Inpatient Hospital Stay (HOSPITAL_COMMUNITY): Payer: Medicaid Other | Admitting: Anesthesiology

## 2022-06-11 ENCOUNTER — Encounter: Payer: Medicaid Other | Admitting: Family Medicine

## 2022-06-11 ENCOUNTER — Other Ambulatory Visit: Payer: Self-pay

## 2022-06-11 ENCOUNTER — Encounter (HOSPITAL_COMMUNITY): Payer: Self-pay | Admitting: Obstetrics and Gynecology

## 2022-06-11 DIAGNOSIS — O403XX0 Polyhydramnios, third trimester, not applicable or unspecified: Secondary | ICD-10-CM | POA: Diagnosis present

## 2022-06-11 DIAGNOSIS — O4292 Full-term premature rupture of membranes, unspecified as to length of time between rupture and onset of labor: Secondary | ICD-10-CM | POA: Diagnosis present

## 2022-06-11 DIAGNOSIS — O0930 Supervision of pregnancy with insufficient antenatal care, unspecified trimester: Secondary | ICD-10-CM

## 2022-06-11 DIAGNOSIS — R87619 Unspecified abnormal cytological findings in specimens from cervix uteri: Secondary | ICD-10-CM | POA: Diagnosis present

## 2022-06-11 DIAGNOSIS — Z3A38 38 weeks gestation of pregnancy: Secondary | ICD-10-CM | POA: Diagnosis not present

## 2022-06-11 DIAGNOSIS — O4290 Premature rupture of membranes, unspecified as to length of time between rupture and onset of labor, unspecified weeks of gestation: Secondary | ICD-10-CM | POA: Diagnosis present

## 2022-06-11 DIAGNOSIS — O0933 Supervision of pregnancy with insufficient antenatal care, third trimester: Secondary | ICD-10-CM | POA: Diagnosis not present

## 2022-06-11 LAB — TYPE AND SCREEN
ABO/RH(D): A POS
Antibody Screen: NEGATIVE

## 2022-06-11 LAB — CBC
HCT: 30.5 % — ABNORMAL LOW (ref 36.0–46.0)
Hemoglobin: 9.8 g/dL — ABNORMAL LOW (ref 12.0–15.0)
MCH: 28.8 pg (ref 26.0–34.0)
MCHC: 32.1 g/dL (ref 30.0–36.0)
MCV: 89.7 fL (ref 80.0–100.0)
Platelets: 200 10*3/uL (ref 150–400)
RBC: 3.4 MIL/uL — ABNORMAL LOW (ref 3.87–5.11)
RDW: 13.7 % (ref 11.5–15.5)
WBC: 10.4 10*3/uL (ref 4.0–10.5)
nRBC: 0 % (ref 0.0–0.2)

## 2022-06-11 LAB — GROUP B STREP BY PCR: Group B strep by PCR: NEGATIVE

## 2022-06-11 LAB — RPR: RPR Ser Ql: NONREACTIVE

## 2022-06-11 MED ORDER — LIDOCAINE HCL (PF) 1 % IJ SOLN
INTRAMUSCULAR | Status: DC | PRN
  Administered 2022-06-11: 5 mL via EPIDURAL
  Administered 2022-06-11: 3 mL via EPIDURAL

## 2022-06-11 MED ORDER — DIBUCAINE (PERIANAL) 1 % EX OINT: 1.0000 | TOPICAL_OINTMENT | CUTANEOUS | Status: DC | PRN

## 2022-06-11 MED ORDER — FENTANYL CITRATE (PF) 100 MCG/2ML IJ SOLN: 50.0000 ug | INTRAMUSCULAR | Status: DC | PRN

## 2022-06-11 MED ORDER — SIMETHICONE 80 MG PO CHEW: 80.0000 mg | CHEWABLE_TABLET | ORAL | Status: DC | PRN

## 2022-06-11 MED ORDER — LEVONORGESTREL 20 MCG/DAY IU IUD
1.0000 | INTRAUTERINE_SYSTEM | Freq: Once | INTRAUTERINE | Status: DC
  Filled 2022-06-11: qty 1

## 2022-06-11 MED ORDER — PRENATAL MULTIVITAMIN CH
1.0000 | ORAL_TABLET | Freq: Every day | ORAL | Status: DC
  Administered 2022-06-11 – 2022-06-12 (×2): 1 via ORAL
  Filled 2022-06-11 (×2): qty 1

## 2022-06-11 MED ORDER — ZOLPIDEM TARTRATE 5 MG PO TABS: 5.0000 mg | ORAL_TABLET | Freq: Every evening | ORAL | Status: DC | PRN

## 2022-06-11 MED ORDER — LACTATED RINGERS IV SOLN: 500.0000 mL | INTRAVENOUS | Status: DC | PRN

## 2022-06-11 MED ORDER — LACTATED RINGERS IV SOLN: INTRAVENOUS | Status: DC

## 2022-06-11 MED ORDER — EPHEDRINE 5 MG/ML INJ: 10.0000 mg | INTRAVENOUS | Status: DC | PRN

## 2022-06-11 MED ORDER — TERBUTALINE SULFATE 1 MG/ML IJ SOLN
INTRAMUSCULAR | Status: AC
  Administered 2022-06-11: 1 mg
  Filled 2022-06-11: qty 1

## 2022-06-11 MED ORDER — WITCH HAZEL-GLYCERIN EX PADS: 1.0000 | MEDICATED_PAD | CUTANEOUS | Status: DC | PRN

## 2022-06-11 MED ORDER — COCONUT OIL OIL: 1.0000 | TOPICAL_OIL | Status: DC | PRN

## 2022-06-11 MED ORDER — LACTATED RINGERS IV SOLN
500.0000 mL | Freq: Once | INTRAVENOUS | Status: AC
  Administered 2022-06-11: 1000 mL via INTRAVENOUS

## 2022-06-11 MED ORDER — FENTANYL-BUPIVACAINE-NACL 0.5-0.125-0.9 MG/250ML-% EP SOLN
EPIDURAL | Status: AC
  Filled 2022-06-11: qty 250

## 2022-06-11 MED ORDER — BENZOCAINE-MENTHOL 20-0.5 % EX AERO: 1.0000 | INHALATION_SPRAY | CUTANEOUS | Status: DC | PRN

## 2022-06-11 MED ORDER — ACETAMINOPHEN 325 MG PO TABS: 650.0000 mg | ORAL_TABLET | ORAL | Status: DC | PRN

## 2022-06-11 MED ORDER — OXYTOCIN-SODIUM CHLORIDE 30-0.9 UT/500ML-% IV SOLN
2.5000 [IU]/h | INTRAVENOUS | Status: DC
  Administered 2022-06-11: 2.5 [IU]/h via INTRAVENOUS
  Filled 2022-06-11: qty 500

## 2022-06-11 MED ORDER — SENNOSIDES-DOCUSATE SODIUM 8.6-50 MG PO TABS
2.0000 | ORAL_TABLET | ORAL | Status: DC
  Administered 2022-06-11 – 2022-06-12 (×2): 2 via ORAL
  Filled 2022-06-11 (×2): qty 2

## 2022-06-11 MED ORDER — ONDANSETRON HCL 4 MG/2ML IJ SOLN: 4.0000 mg | INTRAMUSCULAR | Status: DC | PRN

## 2022-06-11 MED ORDER — DIPHENHYDRAMINE HCL 25 MG PO CAPS: 25.0000 mg | ORAL_CAPSULE | Freq: Four times a day (QID) | ORAL | Status: DC | PRN

## 2022-06-11 MED ORDER — LEVONORGESTREL 20 MCG/DAY IU IUD: 1.0000 | INTRAUTERINE_SYSTEM | Freq: Once | INTRAUTERINE | Status: DC

## 2022-06-11 MED ORDER — DIPHENHYDRAMINE HCL 50 MG/ML IJ SOLN: 12.5000 mg | INTRAMUSCULAR | Status: DC | PRN

## 2022-06-11 MED ORDER — OXYCODONE HCL 5 MG PO TABS: 5.0000 mg | ORAL_TABLET | ORAL | Status: DC | PRN

## 2022-06-11 MED ORDER — IBUPROFEN 600 MG PO TABS
600.0000 mg | ORAL_TABLET | Freq: Four times a day (QID) | ORAL | Status: DC
  Administered 2022-06-11 – 2022-06-12 (×5): 600 mg via ORAL
  Filled 2022-06-11 (×5): qty 1

## 2022-06-11 MED ORDER — FENTANYL-BUPIVACAINE-NACL 0.5-0.125-0.9 MG/250ML-% EP SOLN
12.0000 mL/h | EPIDURAL | Status: DC | PRN
  Administered 2022-06-11: 12 mL/h via EPIDURAL

## 2022-06-11 MED ORDER — ONDANSETRON HCL 4 MG/2ML IJ SOLN: 4.0000 mg | Freq: Four times a day (QID) | INTRAMUSCULAR | Status: DC | PRN

## 2022-06-11 MED ORDER — MEASLES, MUMPS & RUBELLA VAC IJ SOLR: 0.5000 mL | Freq: Once | INTRAMUSCULAR | Status: DC

## 2022-06-11 MED ORDER — FENTANYL CITRATE (PF) 100 MCG/2ML IJ SOLN: 100.0000 ug | INTRAMUSCULAR | Status: DC | PRN

## 2022-06-11 MED ORDER — TETANUS-DIPHTH-ACELL PERTUSSIS 5-2.5-18.5 LF-MCG/0.5 IM SUSY: 0.5000 mL | PREFILLED_SYRINGE | Freq: Once | INTRAMUSCULAR | Status: DC

## 2022-06-11 MED ORDER — OXYTOCIN BOLUS FROM INFUSION
333.0000 mL | Freq: Once | INTRAVENOUS | Status: AC
  Administered 2022-06-11: 333 mL via INTRAVENOUS

## 2022-06-11 MED ORDER — PHENYLEPHRINE 80 MCG/ML (10ML) SYRINGE FOR IV PUSH (FOR BLOOD PRESSURE SUPPORT): 80.0000 ug | PREFILLED_SYRINGE | INTRAVENOUS | Status: DC | PRN

## 2022-06-11 MED ORDER — OXYCODONE-ACETAMINOPHEN 5-325 MG PO TABS: 2.0000 | ORAL_TABLET | ORAL | Status: DC | PRN

## 2022-06-11 MED ORDER — ACETAMINOPHEN 325 MG PO TABS
650.0000 mg | ORAL_TABLET | ORAL | Status: DC | PRN
  Administered 2022-06-12: 650 mg via ORAL
  Filled 2022-06-11: qty 2

## 2022-06-11 MED ORDER — SOD CITRATE-CITRIC ACID 500-334 MG/5ML PO SOLN: 30.0000 mL | ORAL | Status: DC | PRN

## 2022-06-11 MED ORDER — OXYCODONE-ACETAMINOPHEN 5-325 MG PO TABS: 1.0000 | ORAL_TABLET | ORAL | Status: DC | PRN

## 2022-06-11 MED ORDER — ONDANSETRON HCL 4 MG PO TABS: 4.0000 mg | ORAL_TABLET | ORAL | Status: DC | PRN

## 2022-06-11 MED ORDER — LIDOCAINE HCL (PF) 1 % IJ SOLN: 30.0000 mL | INTRAMUSCULAR | Status: DC | PRN

## 2022-06-11 MED ORDER — BUPIVACAINE HCL (PF) 0.25 % IJ SOLN
INTRAMUSCULAR | Status: DC | PRN
  Administered 2022-06-11 (×2): 4 mL via EPIDURAL

## 2022-06-11 NOTE — Lactation Note (Signed)
This note was copied from a baby's chart. Lactation Consultation Note  Patient Name: Heather Hoffman Today's Date: 06/11/2022 Age:23 hours Reason for consult: Initial assessment;Early term 37-38.6wks  P3, Attempted latching.  Repositioned baby from cradle to cross cradle for attempt but baby sleepy.  Mother knows to hand express and give baby drops.  Feed on demand with cues.  Goal 8-12+ times per day after first 24 hrs.  Place baby STS if not cueing.  Discussed cluster feeding, provided pillow for support. Maternal Data Has patient been taught Hand Expression?: Yes Does the patient have breastfeeding experience prior to this delivery?: Yes How long did the patient breastfeed?: 4-5 mos with first child, 2nd child in NICU and pumped  Feeding Mother's Current Feeding Choice: Breast Milk   Interventions Interventions: Breast feeding basics reviewed;Assisted with latch;Skin to skin;Education;LC Services brochure Consult Status Consult Status: Follow-up Date: 06/12/22 Follow-up type: In-patient    Dahlia Byes [facility] 06/11/2022, 10:13 AM

## 2022-06-11 NOTE — Anesthesia Preprocedure Evaluation (Signed)
Anesthesia Evaluation  Patient identified by MRN, date of birth, ID band Patient awake    Reviewed: Allergy & Precautions, NPO status , Patient's Chart, lab work & pertinent test results  Airway Mallampati: III  TM Distance: >3 FB Neck ROM: Full    Dental   Pulmonary neg pulmonary ROS   Pulmonary exam normal breath sounds clear to auscultation       Cardiovascular negative cardio ROS  Rhythm:Regular Rate:Normal     Neuro/Psych  Headaches PSYCHIATRIC DISORDERS  Depression       GI/Hepatic negative GI ROS, Neg liver ROS,,,  Endo/Other  negative endocrine ROS    Renal/GU negative Renal ROS     Musculoskeletal   Abdominal   Peds  Hematology negative hematology ROS (+)   Anesthesia Other Findings   Reproductive/Obstetrics (+) Pregnancy                              Anesthesia Physical Anesthesia Plan  ASA: 2  Anesthesia Plan: Epidural   Post-op Pain Management:    Induction:   PONV Risk Score and Plan:   Airway Management Planned:   Additional Equipment:   Intra-op Plan:   Post-operative Plan:   Informed Consent: I have reviewed the patients History and Physical, chart, labs and discussed the procedure including the risks, benefits and alternatives for the proposed anesthesia with the patient or authorized representative who has indicated his/her understanding and acceptance.       Plan Discussed with: Anesthesiologist  Anesthesia Plan Comments: (I have discussed risks of neuraxial anesthesia including but not limited to infection, bleeding, nerve injury, back pain, headache, seizures, and failure of block. Patient denies bleeding disorders and is not currently anticoagulated. Labs have been reviewed. Risks and benefits discussed. All patient's questions answered.  )         Anesthesia Quick Evaluation

## 2022-06-11 NOTE — Anesthesia Postprocedure Evaluation (Signed)
Anesthesia Post Note  Patient: Heather Hoffman  Procedure(s) Performed: AN AD HOC LABOR EPIDURAL     Patient location during evaluation: Mother Baby Anesthesia Type: Epidural Level of consciousness: awake and alert Pain management: pain level controlled Vital Signs Assessment: post-procedure vital signs reviewed and stable Respiratory status: spontaneous breathing, nonlabored ventilation and respiratory function stable Cardiovascular status: stable Postop Assessment: no headache, no backache and epidural receding Anesthetic complications: no   No notable events documented.  Last Vitals:  Vitals:   06/11/22 1306 06/11/22 1606  BP: 125/75 117/76  Pulse: 62 (!) 59  Resp: 16 16  Temp: 36.7 C 36.9 C  SpO2:      Last Pain:  Vitals:   06/11/22 1606  TempSrc: Oral  PainSc: 0-No pain   Pain Goal:                   Salome Arnt

## 2022-06-11 NOTE — Plan of Care (Signed)
  Problem: Education: Goal: Knowledge of Childbirth will improve 06/11/2022 0720 by Joretta Bachelor, RN Outcome: Completed/Met 06/11/2022 0108 by Joretta Bachelor, RN Outcome: Progressing Goal: Ability to make informed decisions regarding treatment and plan of care will improve 06/11/2022 0720 by Joretta Bachelor, RN Outcome: Completed/Met 06/11/2022 0108 by Joretta Bachelor, RN Outcome: Progressing Goal: Ability to state and carry out methods to decrease the pain will improve 06/11/2022 0720 by Joretta Bachelor, RN Outcome: Completed/Met 06/11/2022 0108 by Joretta Bachelor, RN Outcome: Progressing Goal: Individualized Educational Video(s) 06/11/2022 0720 by Joretta Bachelor, RN Outcome: Completed/Met 06/11/2022 0108 by Joretta Bachelor, RN Outcome: Progressing   Problem: Coping: Goal: Ability to verbalize concerns and feelings about labor and delivery will improve 06/11/2022 0720 by Joretta Bachelor, RN Outcome: Completed/Met 06/11/2022 0108 by Joretta Bachelor, RN Outcome: Progressing   Problem: Life Cycle: Goal: Ability to make normal progression through stages of labor will improve 06/11/2022 0720 by Joretta Bachelor, RN Outcome: Completed/Met 06/11/2022 0108 by Joretta Bachelor, RN Outcome: Progressing Goal: Ability to effectively push during vaginal delivery will improve 06/11/2022 0720 by Joretta Bachelor, RN Outcome: Completed/Met 06/11/2022 0108 by Joretta Bachelor, RN Outcome: Progressing   Problem: Role Relationship: Goal: Will demonstrate positive interactions with the child 06/11/2022 0720 by Joretta Bachelor, RN Outcome: Completed/Met 06/11/2022 0108 by Joretta Bachelor, RN Outcome: Progressing   Problem: Safety: Goal: Risk of complications during labor and delivery will decrease 06/11/2022 0720 by Joretta Bachelor, RN Outcome: Completed/Met 06/11/2022 0108 by Joretta Bachelor, RN Outcome: Progressing   Problem: Pain Management: Goal: Relief or control of pain  from uterine contractions will improve 06/11/2022 0720 by Joretta Bachelor, RN Outcome: Completed/Met 06/11/2022 0108 by Joretta Bachelor, RN Outcome: Progressing

## 2022-06-11 NOTE — Progress Notes (Signed)
Patient ID: Heather Hoffman, female   DOB: 02/06/2000, 22 y.o.   MRN: [id number]  Comfortable w epidural; s/p dose of Terb for early variables (tried position changes/boluses first)  VSS, afebrile FHR 115-120s, +accels, occ early variables, avg LTV Ctx now 3-5 mins Cx 6/90/vtx -3 @ 0320  IUP@38 .2wks Active labor GBS PCR neg  Continue to observe FHR Check cx in about 2 hours or sooner prn symptoms Anticipate vag delivery  Arabella Merles CNM 06/11/2022 4:14 AM

## 2022-06-11 NOTE — Anesthesia Procedure Notes (Signed)
Epidural Patient location during procedure: OB Start time: 06/11/2022 2:11 AM End time: 06/11/2022 2:16 AM  Staffing Anesthesiologist: Linton Rump, MD Performed: anesthesiologist   Preanesthetic Checklist Completed: patient identified, IV checked, site marked, risks and benefits discussed, surgical consent, monitors and equipment checked, pre-op evaluation and timeout performed  Epidural Patient position: sitting Prep: DuraPrep and site prepped and draped Patient monitoring: continuous pulse ox and blood pressure Approach: midline Location: L3-L4 Injection technique: LOR saline  Needle:  Needle type: Tuohy  Needle gauge: 17 G Needle length: 9 cm and 9 Needle insertion depth: 4 cm Catheter type: closed end flexible Catheter size: 19 Gauge Catheter at skin depth: 8 cm Test dose: negative  Assessment Events: blood not aspirated, no cerebrospinal fluid, injection not painful, no injection resistance, no paresthesia and negative IV test  Additional Notes The patient has requested an epidural for labor pain management. Risks and benefits including, but not limited to, infection, bleeding, local anesthetic toxicity, headache, hypotension, back pain, block failure, etc. were discussed with the patient. The patient expressed understanding and consented to the procedure. I confirmed that the patient has no bleeding disorders and is not taking blood thinners. I confirmed the patient's last platelet count with the nurse. A time-out was performed immediately prior to the procedure. Please see nursing documentation for vital signs. Sterile technique was used throughout the whole procedure. Once LOR achieved, the epidural catheter threaded easily without resistance. Aspiration of the catheter was negative for blood and CSF. The epidural was dosed slowly and an infusion was started.  1 attempt(s)Reason for block:procedure for pain

## 2022-06-11 NOTE — Plan of Care (Signed)

## 2022-06-11 NOTE — Discharge Summary (Signed)
Postpartum Discharge Summary     Patient Name: Heather Hoffman DOB: [birth year] MRN: [id number]  Date of admission: 06/10/2022 Delivery date:06/11/2022  Delivering provider: Cam Hai D  Date of discharge: 06/12/2022  Admitting diagnosis: Leakage of amniotic fluid [O42.90] Intrauterine pregnancy: [redacted]w[redacted]d     Secondary diagnosis:  Principal Problem:   Leakage of amniotic fluid Active Problems:   History of preterm premature rupture of membranes (PPROM)   Polyhydramnios affecting pregnancy   Abnormal Pap smear of cervix   Prenatal care insufficient  Additional problems: none    Discharge diagnosis: Term Pregnancy Delivered                                              Post partum procedures: none (postplacental IUD attempted) Augmentation:  forebag AROM Complications: None  Hospital course: Induction of Labor With Vaginal Delivery   22 y.o. yo [id number] at [redacted]w[redacted]d was admitted to the hospital 06/10/2022 for induction of labor.  Indication for induction: PROM.  Patient had an uncomplicated labor course.   Membrane Rupture Time/Date: 7:30 AM ,06/10/2022   Delivery Method:Vaginal, Spontaneous  Episiotomy: None  Lacerations:  None  Details of delivery can be found in separate delivery note.  Patient had a postpartum course that was uncomplicated. Patient is discharged home 06/12/22.  Newborn Data: Birth date:06/11/2022  Birth time:4:55 AM  Gender:Female  Living status:Living  Apgars:9 ,9  JYNWGN:5621 g (6lb 5oz)  Magnesium Sulfate received: No BMZ received: No Rhophylac:N/A MMR:N/A T-DaP: offered postpartum Flu: No Transfusion:No  Physical exam  Vitals:   06/11/22 1606 06/11/22 2130 06/12/22 0559 06/12/22 1513  BP: 117/76 120/63 123/76 115/64  Pulse: (!) 59 89 65 65  Resp: 16 16 16 16   Temp: 98.5 F (36.9 C) 98.9 F (37.2 C) 98.8 F (37.1 C) 98.6 F (37 C)  TempSrc: Oral Oral Oral Oral  SpO2:  99% 100% 100%  Weight:      Height:       General: alert, cooperative,  and no distress Lochia: appropriate Uterine Fundus: firm Incision: N/A DVT Evaluation: No evidence of DVT seen on physical exam. Labs: Lab Results  Component Value Date   WBC 10.4 06/10/2022   HGB 9.8 (L) 06/10/2022   HCT 30.5 (L) 06/10/2022   MCV 89.7 06/10/2022   PLT 200 06/10/2022       No data to display         Edinburgh Score:    06/12/2022   12:38 PM  Edinburgh Postnatal Depression Scale Screening Tool  I have been able to laugh and see the funny side of things. 0  I have looked forward with enjoyment to things. 0  I have blamed myself unnecessarily when things went wrong. 1  I have been anxious or worried for no good reason. 3  I have felt scared or panicky for no good reason. 0  Things have been getting on top of me. 0  I have been so unhappy that I have had difficulty sleeping. 0  I have felt sad or miserable. 0  I have been so unhappy that I have been crying. 0  The thought of harming myself has occurred to me. 1  Edinburgh Postnatal Depression Scale Total 5     After visit meds:  Allergies as of 06/12/2022   No Known Allergies      Medication List  TAKE these medications    acetaminophen 325 MG tablet Commonly known as: Tylenol Take 2 tablets (650 mg total) by mouth every 4 (four) hours as needed (for pain scale < 4).   ferrous sulfate 325 (65 FE) MG tablet Take 1 tablet (325 mg total) by mouth daily with breakfast. Start taking on: Jun 13, 2022   ibuprofen 600 MG tablet Commonly known as: ADVIL Take 1 tablet (600 mg total) by mouth every 6 (six) hours.   PrePLUS 27-1 MG Tabs Take 1 tablet by mouth daily.         Discharge home in stable condition Infant Feeding: Breast Infant Disposition:home with mother Discharge instruction: per After Visit Summary and Postpartum booklet. Activity: Advance as tolerated. Pelvic rest for 6 weeks.  Diet: routine diet Future Appointments: Future Appointments  Date Time Provider Department  Center  07/10/2022 10:55 AM Levie Heritage, DO [facility] None  08/06/2022 10:35 AM Adrian Blackwater, Rhona Raider, DO [facility] None   Follow up Visit:  Arabella Merles, CNM  P [facility] Admin Please schedule this patient for Postpartum visit in: 4 weeks with the following provider: Any provider In-Person For C/S patients schedule nurse incision check in weeks 2 weeks: no Low risk pregnancy complicated by: 1 prenatal visit; hx PPROM Delivery mode:  SVD Anticipated Birth Control:  IUD @ pp visit PP Procedures needed: Colpo ~8wks PP Schedule Integrated BH visit: no   06/12/2022 Philomena Buttermore Autry-Lott, DO

## 2022-06-12 MED ORDER — FERROUS SULFATE 325 (65 FE) MG PO TABS
325.0000 mg | ORAL_TABLET | Freq: Every day | ORAL | Status: DC
  Administered 2022-06-12: 325 mg via ORAL
  Filled 2022-06-12: qty 1

## 2022-06-12 MED ORDER — FERROUS SULFATE 325 (65 FE) MG PO TABS: 325.0000 mg | ORAL_TABLET | Freq: Every day | ORAL | 0 refills | Status: AC

## 2022-06-12 MED ORDER — ACETAMINOPHEN 325 MG PO TABS: 650.0000 mg | ORAL_TABLET | ORAL | 0 refills | Status: AC | PRN

## 2022-06-12 MED ORDER — IBUPROFEN 600 MG PO TABS: 600.0000 mg | ORAL_TABLET | Freq: Four times a day (QID) | ORAL | 0 refills | Status: AC

## 2022-06-12 NOTE — Clinical Social Work Maternal (Addendum)
CLINICAL SOCIAL WORK MATERNAL/CHILD NOTE  Patient Details  Name: Heather Hoffman MRN: [id number] Date of Birth: [birth year]  Date:  06/12/2022  Clinical Social Worker Initiating Note:  Enos Fling Date/Time: Initiated:  06/12/22/1032     Child's Name:  Heather Hoffman   Biological Parents:  Mother (Heather Hoffman)   Need for Interpreter:  None   Reason for Referral:  Late or No Prenatal Care     Address:  [address] [place] [address]    Phone number:  [phone] (home)     Additional phone number:   Household Members/Support Persons (HM/SP):   Household Member/Support Person 2, Household Member/Support Person 1   HM/SP Name Relationship DOB or Age  HM/SP -1 Heather Hoffman Daughter 08-07-2019  HM/SP -2 Heather Hoffman 03-25-2021  HM/SP -3        HM/SP -4        HM/SP -5        HM/SP -6        HM/SP -7        HM/SP -8          Natural Supports (not living in the home):  Immediate Family, Extended Family   Professional Supports: None   Employment: Unemployed   Type of Work:     Education:  Engineer, agricultural   Homebound arranged:    Surveyor, quantity Resources:  Medicaid   Other Resources:  Sales executive     Cultural/Religious Considerations Which May Impact Care:    Strengths:  Ability to meet basic needs  , Home prepared for child  , Pediatrician chosen   Psychotropic Medications:         Pediatrician:    [place] area  Pediatrician List:   [facility] ([address])   [facility]      Pediatrician Fax Number:    Risk Factors/Current Problems:      Cognitive State:  Alert  , Able to Concentrate  , Insightful  , Linear Thinking  , Goal Oriented     Mood/Affect:  Calm  , Comfortable  , Relaxed     CSW Assessment: CSW received a consult for limited/ no prenatal care. CSW met MOB at bedside to complete full psychosocial assessment. CSW  entered room, introduced herself and explained the reason for the visit. MOB presented as calm, was agreeable to consult and remained engaged throughout encounter.   CSW collected MOB's demographic information; and she reported CPS history that included Heather Hoffman (08[birth year]16[birth year]2019), [facility] use and open for 1 month; and Heather Hoffman for medical neglect(missed appointments), and she wasn't sure about the status of the case. CSW inquired about MOB's mental health history. MOB reported being diagnosed with anxiety and depression in high school; the diagnoses was due to family struggles. MOB reported being prescribed medication (Zoloft) in the past and denied participating in therapy for support. MOB reported currently feeling "good" and bonded well with infant. CSW provided education regarding the baby blues period vs. perinatal mood disorders, discussed treatment and gave resources for mental health follow up if concerns arise.  CSW recommends self-evaluation during the postpartum time period using the New Mom Checklist from Postpartum Progress and encouraged MOB to contact a medical professional if symptoms are noted at any time. CSW assessed for safety with MOB SI/HI/DV;MOB denied all.  CSW assessed for  resources needs with MOB. MOB reported receiving food stamps for support. MOB reported having all essential items for infant including a car seat and crib for safe sleeping. CSW provided review of Sudden Infant Death Syndrome (SIDS) precautions.  CSW asked MOB about the barriers she experienced with obtaining prenatal care during pregnancy. MOB reported being a single mom and caring for her 2 children during the pregnancy. CSW informed MOB with the limited/no prenatal care during pregnancy; the hospital will perform a UDS and CDS on infant. If the screenings return with positive results a report to CPS will be made; MOB was understanding. MOB denied using any drugs during pregnancy.  CSW made CPS report with [place] intake worker Heather Hoffman) regarding CPS history.   CSW Plan/Description:   No Further Intervention Required/No Barriers to Discharge, Sudden Infant Death Syndrome (SIDS) Education, Perinatal Mood and Anxiety Disorder (PMADs) Education, Hospital Drug Screen Policy Information, Other Information/Referral to [facility], CSW Will Continue to Monitor Umbilical Cord Tissue and Urine Drug Screen Results and Make Report if Heather Coddington, LCSW 06/12/2022, 10:50 AM

## 2022-06-12 NOTE — Progress Notes (Signed)
POSTPARTUM PROGRESS NOTE  Post Partum Day 1  Subjective:  Heather Hoffman is a 22 y.o. [id number] s/p VD at [redacted]w[redacted]d.  She reports she is doing well. No acute events overnight. She denies any problems with ambulating, voiding or po intake. Denies nausea or vomiting.  Pain is well controlled.  Lochia is appropriate.  Objective: Blood pressure 123/76, pulse 65, temperature 98.8 F (37.1 C), temperature source Oral, resp. rate 16, height 5' 6.5" (1.689 m), weight 73.3 kg, last menstrual period 08/30/2021, SpO2 100 %, unknown if currently breastfeeding.  Physical Exam:  General: alert, cooperative and no distress Chest: no respiratory distress Heart:regular rate, distal pulses intact Abdomen: soft, nontender,  Uterine Fundus: firm, appropriately tender DVT Evaluation: No calf swelling or tenderness Extremities: No LE edema Skin: warm, dry  Recent Labs    06/10/22 2357  HGB 9.8*  HCT 30.5*    Assessment/Plan: Heather Hoffman is a 22 y.o. [id number] s/p VD at [redacted]w[redacted]d   PPD#1 - Doing well  Routine postpartum care Contraception: IUD outpt Feeding: Both Dispo: Plan for discharge 5/17-5/18.   LOS: 1 day   Lavonda Jumbo, DO OB Fellow, Faculty Practice [facility], [facility] 06/12/2022, 10:41 AM

## 2022-06-20 ENCOUNTER — Telehealth (HOSPITAL_COMMUNITY): Payer: Self-pay

## 2022-06-20 NOTE — Telephone Encounter (Signed)
Patient reports feeling good. Patient declines questions/concerns about her health and healing.  Patient reports that baby is doing well. Baby sleeps in a bassinet. RN reviewed ABC's of safe sleep with patient. Patient declines any questions or concerns about baby.  EPDS score is 5.  Heather Hoffman [facility]  Perinatal Services   06/20/22,1002

## 2022-07-10 ENCOUNTER — Ambulatory Visit: Payer: Medicaid Other | Admitting: Family Medicine

## 2022-08-06 ENCOUNTER — Other Ambulatory Visit (HOSPITAL_COMMUNITY): Admission: RE | Admit: 2022-08-06 | Payer: Medicaid Other | Source: Ambulatory Visit | Admitting: Family Medicine

## 2022-08-06 ENCOUNTER — Encounter: Payer: Self-pay | Admitting: Family Medicine

## 2022-08-06 ENCOUNTER — Ambulatory Visit (INDEPENDENT_AMBULATORY_CARE_PROVIDER_SITE_OTHER): Payer: Medicaid Other | Admitting: Family Medicine

## 2022-08-06 VITALS — BP 146/92 | HR 94 | Wt 131.0 lb

## 2022-08-06 DIAGNOSIS — R87613 High grade squamous intraepithelial lesion on cytologic smear of cervix (HGSIL): Secondary | ICD-10-CM | POA: Insufficient documentation

## 2022-08-06 NOTE — Progress Notes (Signed)
Patient Name: Heather Hoffman, female   DOB: 24-Jun-2000, 22 y.o.  MRN: [id number]  Colposcopy Procedure Note:  [id number] Pregnancy status: [redacted]w[redacted]d Lab Results  Component Value Date   DIAGPAP (A) 02/11/2022    - High grade squamous intraepithelial lesion (HSIL)    Cervical History: Previous Colposcopy: none Previous LEEP or Cryo: none  Smoking: Never Smoked Hysterectomy: No Other History:   Patient given informed consent, signed copy in the chart, time out was performed.    Exam: Vulva and Vagina grossly normal.  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  Will call patient with results.

## 2022-08-10 LAB — SURGICAL PATHOLOGY

## 2022-08-11 ENCOUNTER — Ambulatory Visit (INDEPENDENT_AMBULATORY_CARE_PROVIDER_SITE_OTHER): Payer: Medicaid Other

## 2022-08-11 ENCOUNTER — Other Ambulatory Visit (HOSPITAL_COMMUNITY)
Admission: RE | Admit: 2022-08-11 | Discharge: 2022-08-11 | Disposition: A | Discharge status: Home / Self Care | Payer: Medicaid Other | Source: Ambulatory Visit | Attending: Family Medicine | Admitting: Family Medicine

## 2022-08-11 VITALS — BP 142/93 | HR 96 | Wt 126.0 lb

## 2022-08-11 DIAGNOSIS — N898 Other specified noninflammatory disorders of vagina: Secondary | ICD-10-CM

## 2022-08-11 DIAGNOSIS — Z113 Encounter for screening for infections with a predominantly sexual mode of transmission: Secondary | ICD-10-CM

## 2022-08-11 NOTE — Progress Notes (Signed)
Patient presents for STD screening. Self swab was sent to the lab. Damian Hofstra l Savhanna Sliva, CMA

## 2022-08-13 LAB — CERVICOVAGINAL ANCILLARY ONLY
Bacterial Vaginitis (gardnerella): POSITIVE — AB
Candida Glabrata: NEGATIVE
Candida Vaginitis: NEGATIVE
Chlamydia: NEGATIVE
Comment: NEGATIVE
Comment: NEGATIVE
Comment: NEGATIVE
Comment: NEGATIVE
Comment: NEGATIVE
Comment: NORMAL
Neisseria Gonorrhea: NEGATIVE
Trichomonas: NEGATIVE

## 2022-08-14 ENCOUNTER — Telehealth: Payer: Self-pay

## 2022-08-14 DIAGNOSIS — B9689 Other specified bacterial agents as the cause of diseases classified elsewhere: Secondary | ICD-10-CM

## 2022-08-14 MED ORDER — METRONIDAZOLE 500 MG PO TABS
500.0000 mg | ORAL_TABLET | Freq: Two times a day (BID) | ORAL | 0 refills | Status: AC
Start: 2022-08-14 — End: ?

## 2022-08-14 NOTE — Telephone Encounter (Signed)
Called patient to inform her that she tested positive for BV. Flagyl 500 mg 1 tab PO BID was sent to her pharmacy. Understanding was voiced. Whitleigh Garramone l Zianna Dercole, CMA

## 2022-09-29 ENCOUNTER — Other Ambulatory Visit (HOSPITAL_COMMUNITY)
Admission: RE | Admit: 2022-09-29 | Discharge: 2022-09-29 | Disposition: A | Discharge status: Home / Self Care | Payer: Medicaid Other | Source: Ambulatory Visit | Attending: Medical | Admitting: Medical

## 2022-09-29 ENCOUNTER — Encounter: Payer: Self-pay | Admitting: Medical

## 2022-09-29 ENCOUNTER — Ambulatory Visit (INDEPENDENT_AMBULATORY_CARE_PROVIDER_SITE_OTHER): Payer: Medicaid Other | Admitting: Medical

## 2022-09-29 VITALS — BP 130/85 | HR 91 | Wt 132.0 lb

## 2022-09-29 DIAGNOSIS — Z113 Encounter for screening for infections with a predominantly sexual mode of transmission: Secondary | ICD-10-CM | POA: Insufficient documentation

## 2022-09-29 DIAGNOSIS — A749 Chlamydial infection, unspecified: Secondary | ICD-10-CM | POA: Diagnosis not present

## 2022-09-29 DIAGNOSIS — Z3009 Encounter for other general counseling and advice on contraception: Secondary | ICD-10-CM

## 2022-09-29 MED ORDER — NORELGESTROMIN-ETH ESTRADIOL 150-35 MCG/24HR TD PTWK
1.0000 | MEDICATED_PATCH | TRANSDERMAL | 12 refills | Status: AC
Start: 2022-09-29 — End: ?

## 2022-09-29 NOTE — Progress Notes (Signed)
   History:  [name] is a 22 y.o. [phone] who presents to clinic today for birth control counseling. Patient had her third child in May and has been sexually active, but does not desire future pregnancy. She would like options for birth control today. Last IC 2 days ago without condoms. She is exclusively breastfeeding, so risk of pregnancy is very low. She also reports taking weekly pregnancy tests for the last 3 weeks that were negative.    The following portions of the patient's history were reviewed and updated as appropriate: allergies, current medications, family history, past medical history, social history, past surgical history and problem list.  Review of Systems:  Review of Systems  Constitutional:  Negative for fever.  Gastrointestinal:  Negative for abdominal pain.  Genitourinary:        Neg - vaginal bleeding, discharge      Objective:  Physical Exam BP 130/85   Pulse 91   Wt 132 lb (59.9 kg)   BMI 20.99 kg/m  Physical Exam Constitutional:      Appearance: Normal appearance. She is normal weight. She is not ill-appearing.  Cardiovascular:     Rate and Rhythm: Normal rate.  Pulmonary:     Effort: Pulmonary effort is normal.  Abdominal:     General: Abdomen is flat. There is no distension.  Skin:    General: Skin is warm and dry.     Findings: No erythema.  Neurological:     Mental Status: She is alert and oriented to person, place, and time.  Psychiatric:        Mood and Affect: Mood normal.     Health Maintenance Due  Topic Date Due   HPV VACCINES (1 - 3-dose series) Never done   DTaP/Tdap/Td (1 - Tdap) Never done   INFLUENZA VACCINE  Never done   COVID-19 Vaccine (1 - 2023-24 season) Never done    Labs, imaging and previous visits in Epic and Care Everywhere reviewed  Assessment & Plan:  1. Unwanted fertility - negative UPT today  - Discussed all options for birth control safe with breastfeeding  - norelgestromin-ethinyl estradiol  [facility]r Medico) 150-35 MCG/24HR transdermal patch; Place 1 patch onto the skin once a week.  Dispense: 3 patch; Refill: 12  How To Be Reasonably Certain that a Woman Is Not Pregnant (CDC SPR Guideline) May offer immediate initiation of contraception during the same visit the need is identified and a method is chosen, including for IUDs and implants.  ? A health care provider can be reasonably certain that a woman is not pregnant if she has no symptoms or signs of pregnancy and mets any one of the following criteria:   Is < 7 days after the start of normal menses   Has not had sexual intercourse since the start of last normal menses   Has been correctly and consistently using a reliable method of contraception   Is < 7 days after spontaneous or induced abortion   Is within 4 weeks postpartum   Is fully or nearly fully breastfeeding (exclusively breastfeeding or the vast majority [>85%] of feeds are breastfeeds), amenorrheic, and < 6 months postpartum  2. Screen for STD (sexually transmitted disease) - Cervicovaginal ancillary only( [facility])   Return in about 1 year (around 09/29/2023) for Annual exam.  Marny Lowenstein, PA-C 09/29/2022 11:35 AM

## 2022-09-29 NOTE — Progress Notes (Signed)
Patient would like to discuss birth control options.patient on phone during nurse intake. Heather Hoffman

## 2022-10-01 LAB — CERVICOVAGINAL ANCILLARY ONLY
Bacterial Vaginitis (gardnerella): POSITIVE — AB
Candida Glabrata: NEGATIVE
Candida Vaginitis: NEGATIVE
Chlamydia: POSITIVE — AB
Comment: NEGATIVE
Comment: NEGATIVE
Comment: NEGATIVE
Comment: NEGATIVE
Comment: NEGATIVE
Comment: NORMAL
Neisseria Gonorrhea: NEGATIVE
Trichomonas: NEGATIVE

## 2022-10-02 ENCOUNTER — Other Ambulatory Visit: Payer: Self-pay

## 2022-10-02 MED ORDER — AZITHROMYCIN 250 MG PO TABS
1000.0000 mg | ORAL_TABLET | Freq: Once | ORAL | 0 refills | Status: AC
Start: 2022-10-02 — End: 2022-10-02

## 2022-10-02 NOTE — Addendum Note (Signed)
Addended by: Marny Lowenstein on: 10/02/2022 10:41 AM   Modules accepted: Orders
